# Patient Record
Sex: Male | Born: 1955 | Race: White | Hispanic: No | Marital: Married | Smoking: Never smoker
Health system: Southern US, Community
[De-identification: ages and names within clinical notes are randomized; demographics above are authoritative.]

## PROBLEM LIST (undated history)

## (undated) DIAGNOSIS — K760 Fatty (change of) liver, not elsewhere classified: Secondary | ICD-10-CM

## (undated) DIAGNOSIS — K219 Gastro-esophageal reflux disease without esophagitis: Secondary | ICD-10-CM

## (undated) DIAGNOSIS — R931 Abnormal findings on diagnostic imaging of heart and coronary circulation: Secondary | ICD-10-CM

## (undated) DIAGNOSIS — E785 Hyperlipidemia, unspecified: Secondary | ICD-10-CM

## (undated) HISTORY — DX: Abnormal findings on diagnostic imaging of heart and coronary circulation: R93.1

## (undated) HISTORY — DX: Gastro-esophageal reflux disease without esophagitis: K21.9

## (undated) HISTORY — DX: Fatty (change of) liver, not elsewhere classified: K76.0

## (undated) HISTORY — DX: Hyperlipidemia, unspecified: E78.5

---

## 2005-02-24 ENCOUNTER — Encounter: Admission: RE | Admit: 2005-02-24 | Discharge: 2005-02-24 | Payer: Self-pay | Admitting: Family Medicine

## 2005-09-18 ENCOUNTER — Encounter: Admission: RE | Admit: 2005-09-18 | Discharge: 2005-09-18 | Payer: Self-pay | Admitting: Family Medicine

## 2006-04-21 ENCOUNTER — Encounter: Admission: RE | Admit: 2006-04-21 | Discharge: 2006-04-21 | Payer: Self-pay | Admitting: Emergency Medicine

## 2010-11-09 ENCOUNTER — Encounter: Admission: RE | Admit: 2010-11-09 | Discharge: 2010-11-09 | Payer: Self-pay | Admitting: Family Medicine

## 2012-12-27 ENCOUNTER — Ambulatory Visit
Admission: RE | Admit: 2012-12-27 | Discharge: 2012-12-27 | Disposition: A | Discharge status: Home / Self Care | Payer: BC Managed Care – PPO | Source: Ambulatory Visit | Attending: Family Medicine | Admitting: Family Medicine

## 2012-12-27 ENCOUNTER — Other Ambulatory Visit: Payer: Self-pay | Admitting: Family Medicine

## 2012-12-27 DIAGNOSIS — R079 Chest pain, unspecified: Secondary | ICD-10-CM

## 2013-01-03 ENCOUNTER — Emergency Department (HOSPITAL_COMMUNITY): Payer: BC Managed Care – PPO

## 2013-01-03 ENCOUNTER — Encounter (HOSPITAL_COMMUNITY): Payer: Self-pay | Admitting: *Deleted

## 2013-01-03 DIAGNOSIS — E785 Hyperlipidemia, unspecified: Secondary | ICD-10-CM | POA: Insufficient documentation

## 2013-01-03 DIAGNOSIS — R079 Chest pain, unspecified: Secondary | ICD-10-CM | POA: Insufficient documentation

## 2013-01-03 DIAGNOSIS — Z791 Long term (current) use of non-steroidal anti-inflammatories (NSAID): Secondary | ICD-10-CM | POA: Insufficient documentation

## 2013-01-03 LAB — CBC WITH DIFFERENTIAL/PLATELET
Basophils Relative: 0 % (ref 0–1)
Eosinophils Absolute: 0.2 10*3/uL (ref 0.0–0.7)
Eosinophils Relative: 3 % (ref 0–5)
Hemoglobin: 14.9 g/dL (ref 13.0–17.0)
Lymphocytes Relative: 42 % (ref 12–46)
Lymphs Abs: 2.9 10*3/uL (ref 0.7–4.0)
MCH: 33.3 pg (ref 26.0–34.0)
MCV: 90.6 fL (ref 78.0–100.0)
Neutrophils Relative %: 46 % (ref 43–77)
Platelets: 219 10*3/uL (ref 150–400)
RBC: 4.47 MIL/uL (ref 4.22–5.81)
RDW: 12.1 % (ref 11.5–15.5)
WBC: 6.8 10*3/uL (ref 4.0–10.5)

## 2013-01-03 LAB — COMPREHENSIVE METABOLIC PANEL
ALT: 25 U/L (ref 0–53)
Albumin: 4.1 g/dL (ref 3.5–5.2)
Alkaline Phosphatase: 86 U/L (ref 39–117)
CO2: 22 mEq/L (ref 19–32)
Calcium: 9.5 mg/dL (ref 8.4–10.5)
Chloride: 103 mEq/L (ref 96–112)
Creatinine, Ser: 0.71 mg/dL (ref 0.50–1.35)
GFR calc Af Amer: >90 mL/min (ref >90)
GFR calc non Af Amer: >90 mL/min (ref >90)
Glucose, Bld: 123 mg/dL — ABNORMAL HIGH (ref 70–99)
Potassium: 3.5 mEq/L (ref 3.5–5.1)
Sodium: 138 mEq/L (ref 135–145)
Total Bilirubin: 0.4 mg/dL (ref 0.3–1.2)

## 2013-01-03 LAB — TROPONIN I: Troponin I: <0.3 ng/mL (ref <0.30)

## 2013-01-03 NOTE — ED Notes (Signed)
The pt has been having lt sided chest and abd pain for 2-3 weeks.  He  Was seen at his doctors office and had an ekg and a chest xray and he was given naproxen which he reports has not helped.  The pain gets worse when lying down and with inspiration.  No distress at  present

## 2013-01-04 ENCOUNTER — Emergency Department (HOSPITAL_COMMUNITY)
Admission: EM | Admit: 2013-01-04 | Discharge: 2013-01-04 | Disposition: A | Discharge status: Home / Self Care | Payer: BC Managed Care – PPO | Attending: Emergency Medicine | Admitting: Emergency Medicine

## 2013-01-04 ENCOUNTER — Encounter (HOSPITAL_COMMUNITY): Payer: Self-pay | Admitting: Radiology

## 2013-01-04 ENCOUNTER — Emergency Department (HOSPITAL_COMMUNITY): Payer: BC Managed Care – PPO

## 2013-01-04 DIAGNOSIS — R079 Chest pain, unspecified: Secondary | ICD-10-CM

## 2013-01-04 MED ORDER — IOHEXOL 350 MG/ML SOLN
100.0000 mL | Freq: Once | INTRAVENOUS | Status: AC | PRN
  Administered 2013-01-04: 100 mL via INTRAVENOUS

## 2013-01-04 NOTE — ED Notes (Signed)
Pt is pain free now

## 2013-01-04 NOTE — ED Provider Notes (Signed)
History     CSN: [id number]  Arrival date & time 01/03/13  2152   First MD Initiated Contact with Patient 01/04/13 0136      Chief Complaint  Patient presents with  . Chest Pain    (Consider location/radiation/quality/duration/timing/severity/associated sxs/prior treatment) HPI 57 yo male presents to the ER with complaint of 2-3 weeks of left upper chest pain.  Tonight he had burning type pain just under his nipple lasting about an hour.  Pt was seen in his primary care office, had ekg and cxr, given naprosyn.  Pain is worse with lying flat and resting, and with taking deep breath.  No pain at present.  Pt is concerned about cause of pain.  He reports he has some relief with massage last week, but pain returned the next day.  Pt denies any pain with exertion.  Pt able to play full soccer game on Sunday without pain.  NO h/o htn, diabetes, reports mild diet controlled hyperlipidemia.  No family history of CAD.   History reviewed. No pertinent past medical history.  History reviewed. No pertinent past surgical history.  No family history on file.  History  Substance Use Topics  . Smoking status: Never Smoker   . Smokeless tobacco: Not on file  . Alcohol Use: Yes      Review of Systems  All other systems reviewed and are negative.    Allergies  Review of patient's allergies indicates no known allergies.  Home Medications   Current Outpatient Rx  Name  Route  Sig  Dispense  Refill  . ADULT MULTIVITAMIN W/MINERALS CH   Oral   Take 1 tablet by mouth daily.         Marland Kitchen NAPROXEN 500 MG PO TABS   Oral   Take 500 mg by mouth 2 (two) times daily with a meal.         . PROBIOTIC PO   Oral   Take 1 capsule by mouth daily.           BP 135/64  Pulse 61  Temp 97.8 F (36.6 C) (Oral)  Resp 13  SpO2 96%  Physical Exam  Nursing note and vitals reviewed. Constitutional: He is oriented to person, place, and time. He appears well-developed and well-nourished.  HENT:   Head: Normocephalic and atraumatic.  Nose: Nose normal.  Mouth/Throat: Oropharynx is clear and moist.  Eyes: Conjunctivae normal and EOM are normal. Pupils are equal, round, and reactive to light.  Neck: Normal range of motion. Neck supple. No JVD present. No tracheal deviation present. No thyromegaly present.  Cardiovascular: Normal rate, regular rhythm, normal heart sounds and intact distal pulses.  Exam reveals no gallop and no friction rub.   No murmur heard. Pulmonary/Chest: Effort normal and breath sounds normal. No stridor. No respiratory distress. He has no wheezes. He has no rales. He exhibits no tenderness.  Abdominal: Soft. Bowel sounds are normal. He exhibits no distension and no mass. There is no tenderness. There is no rebound and no guarding.  Musculoskeletal: Normal range of motion. He exhibits no edema and no tenderness.  Lymphadenopathy:    He has no cervical adenopathy.  Neurological: He is alert and oriented to person, place, and time. He exhibits normal muscle tone. Coordination normal.  Skin: Skin is warm and dry. No rash noted. No erythema. No pallor.  Psychiatric: He has a normal mood and affect. His behavior is normal. Judgment and thought content normal.    ED Course  Procedures (  including critical care time)  Labs Reviewed  CBC WITH DIFFERENTIAL - Abnormal; Notable for the following:    MCHC 36.8 (*)     All other components within normal limits  COMPREHENSIVE METABOLIC PANEL - Abnormal; Notable for the following:    Glucose, Bld 123 (*)     All other components within normal limits  TROPONIN I   Dg Chest 2 View  01/03/2013  *RADIOLOGY REPORT*  Clinical Data: Chest pain.  CHEST - 2 VIEW  Comparison: 12/27/2012  Findings: Two views of the chest demonstrate clear lungs. Heart and mediastinum are within normal limits.  Bony thorax is intact.  No evidence for pulmonary edema.  IMPRESSION: No acute chest findings.   Original Report Authenticated By: Richarda Overlie,  M.D.    Ct Angio Chest Pe W/cm &/or Wo Cm  01/04/2013  *RADIOLOGY REPORT*  Clinical Data: Pleuritic chest pain; abdominal pain.  EKG changes.  CT ANGIOGRAPHY CHEST  Technique:  Multidetector CT imaging of the chest using the standard protocol during bolus administration of intravenous contrast. Multiplanar reconstructed images including MIPs were obtained and reviewed to evaluate the vascular anatomy.  Contrast: OMNIPAQUE IOHEXOL 350 MG/ML SOLN  Comparison: Chest radiograph performed 01/03/2013  Findings: There is no evidence of pulmonary embolus.  Minimal bibasilar dependent subsegmental atelectasis is noted.  The lungs are otherwise clear.  There is no evidence of significant focal consolidation, pleural effusion or pneumothorax.  No masses are identified; no abnormal focal contrast enhancement is seen.  The mediastinum is unremarkable in appearance.  No mediastinal lymphadenopathy is seen.  No pericardial effusion is identified. The great vessels are grossly unremarkable in appearance, aside from mild calcification at the origin of the right vertebral artery.  No axillary lymphadenopathy is seen.  The visualized portions of the thyroid gland are unremarkable in appearance.  The visualized portions of the liver and spleen are unremarkable, aside from a calcified granuloma at the peripheral hepatic dome. The visualized portions of the pancreas, stomach, adrenal glands and kidneys are within normal limits.  No acute osseous abnormalities are seen.  IMPRESSION:  1.  No evidence of pulmonary embolus. 2.  Minimal bibasilar dependent subsegmental atelectasis noted; lungs otherwise clear.   Original Report Authenticated By: Tonia Ghent, M.D.     Date: 01/04/2013  Rate: 60  Rhythm: normal sinus rhythm  QRS Axis: normal  Intervals: normal  ST/T Wave abnormalities: normal  Conduction Disutrbances:none  Narrative Interpretation:   Old EKG Reviewed: none available   1. Chest pain       MDM    57 year old male with 2-3 weeks of left-sided chest pain. Workup here has been unremarkable. CT angiogram or obtained 2 to pleuritic type pain. EKG normal, troponin negative. Patient able to play a full game of soccer earlier in the week without worsening or change in his pain. Suspect musculoskeletal in origin. We'll refer back to his primary care Dr. for further workup.        Olivia Mackie, MD 01/04/13 2053

## 2013-01-04 NOTE — ED Notes (Signed)
Pt discharged.Vital signs stable and GCS 15 

## 2014-12-10 ENCOUNTER — Ambulatory Visit
Admission: RE | Admit: 2014-12-10 | Discharge: 2014-12-10 | Disposition: A | Discharge status: Home / Self Care | Payer: BC Managed Care – PPO | Source: Ambulatory Visit | Attending: Family Medicine | Admitting: Family Medicine

## 2014-12-10 ENCOUNTER — Other Ambulatory Visit: Payer: Self-pay | Admitting: Family Medicine

## 2014-12-10 DIAGNOSIS — K5900 Constipation, unspecified: Secondary | ICD-10-CM

## 2016-07-29 DIAGNOSIS — Z0000 Encounter for general adult medical examination without abnormal findings: Secondary | ICD-10-CM | POA: Diagnosis not present

## 2016-07-29 DIAGNOSIS — Z125 Encounter for screening for malignant neoplasm of prostate: Secondary | ICD-10-CM | POA: Diagnosis not present

## 2016-07-29 DIAGNOSIS — E782 Mixed hyperlipidemia: Secondary | ICD-10-CM | POA: Diagnosis not present

## 2016-09-22 DIAGNOSIS — M542 Cervicalgia: Secondary | ICD-10-CM | POA: Diagnosis not present

## 2016-09-22 DIAGNOSIS — H9202 Otalgia, left ear: Secondary | ICD-10-CM | POA: Diagnosis not present

## 2017-09-11 DIAGNOSIS — Z0000 Encounter for general adult medical examination without abnormal findings: Secondary | ICD-10-CM | POA: Diagnosis not present

## 2017-09-11 DIAGNOSIS — Z79899 Other long term (current) drug therapy: Secondary | ICD-10-CM | POA: Diagnosis not present

## 2017-09-11 DIAGNOSIS — E782 Mixed hyperlipidemia: Secondary | ICD-10-CM | POA: Diagnosis not present

## 2018-03-13 DIAGNOSIS — J329 Chronic sinusitis, unspecified: Secondary | ICD-10-CM | POA: Diagnosis not present

## 2018-03-13 DIAGNOSIS — L989 Disorder of the skin and subcutaneous tissue, unspecified: Secondary | ICD-10-CM | POA: Diagnosis not present

## 2018-03-13 DIAGNOSIS — J209 Acute bronchitis, unspecified: Secondary | ICD-10-CM | POA: Diagnosis not present

## 2018-03-20 DIAGNOSIS — C44319 Basal cell carcinoma of skin of other parts of face: Secondary | ICD-10-CM | POA: Diagnosis not present

## 2018-04-06 DIAGNOSIS — J9801 Acute bronchospasm: Secondary | ICD-10-CM | POA: Diagnosis not present

## 2018-09-12 DIAGNOSIS — Z0000 Encounter for general adult medical examination without abnormal findings: Secondary | ICD-10-CM | POA: Diagnosis not present

## 2018-09-12 DIAGNOSIS — E782 Mixed hyperlipidemia: Secondary | ICD-10-CM | POA: Diagnosis not present

## 2018-09-12 DIAGNOSIS — Z79899 Other long term (current) drug therapy: Secondary | ICD-10-CM | POA: Diagnosis not present

## 2019-07-01 DIAGNOSIS — E782 Mixed hyperlipidemia: Secondary | ICD-10-CM | POA: Diagnosis not present

## 2019-07-01 DIAGNOSIS — M6780 Other specified disorders of synovium and tendon, unspecified site: Secondary | ICD-10-CM | POA: Diagnosis not present

## 2019-07-01 DIAGNOSIS — L609 Nail disorder, unspecified: Secondary | ICD-10-CM | POA: Diagnosis not present

## 2019-07-01 DIAGNOSIS — H8110 Benign paroxysmal vertigo, unspecified ear: Secondary | ICD-10-CM | POA: Diagnosis not present

## 2019-10-09 DIAGNOSIS — E559 Vitamin D deficiency, unspecified: Secondary | ICD-10-CM | POA: Diagnosis not present

## 2019-10-09 DIAGNOSIS — Z125 Encounter for screening for malignant neoplasm of prostate: Secondary | ICD-10-CM | POA: Diagnosis not present

## 2019-10-09 DIAGNOSIS — R39198 Other difficulties with micturition: Secondary | ICD-10-CM | POA: Diagnosis not present

## 2019-10-09 DIAGNOSIS — N390 Urinary tract infection, site not specified: Secondary | ICD-10-CM | POA: Diagnosis not present

## 2019-10-09 DIAGNOSIS — Z0000 Encounter for general adult medical examination without abnormal findings: Secondary | ICD-10-CM | POA: Diagnosis not present

## 2019-10-09 DIAGNOSIS — E782 Mixed hyperlipidemia: Secondary | ICD-10-CM | POA: Diagnosis not present

## 2019-10-29 DIAGNOSIS — R972 Elevated prostate specific antigen [PSA]: Secondary | ICD-10-CM | POA: Diagnosis not present

## 2019-12-19 DIAGNOSIS — Z03818 Encounter for observation for suspected exposure to other biological agents ruled out: Secondary | ICD-10-CM | POA: Diagnosis not present

## 2020-02-12 DIAGNOSIS — Z03818 Encounter for observation for suspected exposure to other biological agents ruled out: Secondary | ICD-10-CM | POA: Diagnosis not present

## 2020-04-21 DIAGNOSIS — R55 Syncope and collapse: Secondary | ICD-10-CM | POA: Diagnosis not present

## 2020-07-03 DIAGNOSIS — Z20822 Contact with and (suspected) exposure to covid-19: Secondary | ICD-10-CM | POA: Diagnosis not present

## 2020-08-24 DIAGNOSIS — D2262 Melanocytic nevi of left upper limb, including shoulder: Secondary | ICD-10-CM | POA: Diagnosis not present

## 2020-08-24 DIAGNOSIS — Z85828 Personal history of other malignant neoplasm of skin: Secondary | ICD-10-CM | POA: Diagnosis not present

## 2020-08-24 DIAGNOSIS — L905 Scar conditions and fibrosis of skin: Secondary | ICD-10-CM | POA: Diagnosis not present

## 2020-08-24 DIAGNOSIS — D225 Melanocytic nevi of trunk: Secondary | ICD-10-CM | POA: Diagnosis not present

## 2020-10-08 DIAGNOSIS — Z03818 Encounter for observation for suspected exposure to other biological agents ruled out: Secondary | ICD-10-CM | POA: Diagnosis not present

## 2020-10-08 DIAGNOSIS — Z20822 Contact with and (suspected) exposure to covid-19: Secondary | ICD-10-CM | POA: Diagnosis not present

## 2020-10-19 DIAGNOSIS — Z20822 Contact with and (suspected) exposure to covid-19: Secondary | ICD-10-CM | POA: Diagnosis not present

## 2020-11-04 DIAGNOSIS — Z0000 Encounter for general adult medical examination without abnormal findings: Secondary | ICD-10-CM | POA: Diagnosis not present

## 2020-11-04 DIAGNOSIS — E559 Vitamin D deficiency, unspecified: Secondary | ICD-10-CM | POA: Diagnosis not present

## 2020-11-04 DIAGNOSIS — E782 Mixed hyperlipidemia: Secondary | ICD-10-CM | POA: Diagnosis not present

## 2020-11-04 DIAGNOSIS — N401 Enlarged prostate with lower urinary tract symptoms: Secondary | ICD-10-CM | POA: Diagnosis not present

## 2020-12-25 DIAGNOSIS — L570 Actinic keratosis: Secondary | ICD-10-CM | POA: Diagnosis not present

## 2020-12-25 DIAGNOSIS — L300 Nummular dermatitis: Secondary | ICD-10-CM | POA: Diagnosis not present

## 2021-05-24 DIAGNOSIS — L239 Allergic contact dermatitis, unspecified cause: Secondary | ICD-10-CM | POA: Diagnosis not present

## 2021-11-19 DIAGNOSIS — Z0000 Encounter for general adult medical examination without abnormal findings: Secondary | ICD-10-CM | POA: Diagnosis not present

## 2021-11-19 DIAGNOSIS — E782 Mixed hyperlipidemia: Secondary | ICD-10-CM | POA: Diagnosis not present

## 2021-11-19 DIAGNOSIS — E559 Vitamin D deficiency, unspecified: Secondary | ICD-10-CM | POA: Diagnosis not present

## 2021-11-23 ENCOUNTER — Other Ambulatory Visit (HOSPITAL_BASED_OUTPATIENT_CLINIC_OR_DEPARTMENT_OTHER): Payer: Self-pay | Admitting: Family Medicine

## 2021-11-23 DIAGNOSIS — E782 Mixed hyperlipidemia: Secondary | ICD-10-CM

## 2021-12-02 ENCOUNTER — Ambulatory Visit (HOSPITAL_BASED_OUTPATIENT_CLINIC_OR_DEPARTMENT_OTHER)
Admission: RE | Admit: 2021-12-02 | Discharge: 2021-12-02 | Disposition: A | Discharge status: Home / Self Care | Payer: Self-pay | Source: Ambulatory Visit | Attending: Family Medicine | Admitting: Family Medicine

## 2021-12-02 ENCOUNTER — Encounter (HOSPITAL_BASED_OUTPATIENT_CLINIC_OR_DEPARTMENT_OTHER): Payer: Self-pay

## 2021-12-02 ENCOUNTER — Other Ambulatory Visit: Payer: Self-pay

## 2021-12-02 DIAGNOSIS — E782 Mixed hyperlipidemia: Secondary | ICD-10-CM | POA: Insufficient documentation

## 2021-12-07 DIAGNOSIS — I2510 Atherosclerotic heart disease of native coronary artery without angina pectoris: Secondary | ICD-10-CM | POA: Diagnosis not present

## 2021-12-07 DIAGNOSIS — R6882 Decreased libido: Secondary | ICD-10-CM | POA: Diagnosis not present

## 2021-12-10 DIAGNOSIS — R6882 Decreased libido: Secondary | ICD-10-CM | POA: Diagnosis not present

## 2022-02-08 DIAGNOSIS — E782 Mixed hyperlipidemia: Secondary | ICD-10-CM | POA: Diagnosis not present

## 2022-04-28 DIAGNOSIS — L237 Allergic contact dermatitis due to plants, except food: Secondary | ICD-10-CM | POA: Diagnosis not present

## 2022-05-12 DIAGNOSIS — R350 Frequency of micturition: Secondary | ICD-10-CM | POA: Diagnosis not present

## 2022-05-12 DIAGNOSIS — M791 Myalgia, unspecified site: Secondary | ICD-10-CM | POA: Diagnosis not present

## 2022-05-12 DIAGNOSIS — L819 Disorder of pigmentation, unspecified: Secondary | ICD-10-CM | POA: Diagnosis not present

## 2022-05-12 DIAGNOSIS — I2510 Atherosclerotic heart disease of native coronary artery without angina pectoris: Secondary | ICD-10-CM | POA: Diagnosis not present

## 2022-07-07 DIAGNOSIS — M791 Myalgia, unspecified site: Secondary | ICD-10-CM | POA: Diagnosis not present

## 2022-07-07 DIAGNOSIS — M2550 Pain in unspecified joint: Secondary | ICD-10-CM | POA: Diagnosis not present

## 2022-07-07 DIAGNOSIS — E782 Mixed hyperlipidemia: Secondary | ICD-10-CM | POA: Diagnosis not present

## 2022-08-16 DIAGNOSIS — R350 Frequency of micturition: Secondary | ICD-10-CM | POA: Diagnosis not present

## 2022-08-23 ENCOUNTER — Encounter (HOSPITAL_BASED_OUTPATIENT_CLINIC_OR_DEPARTMENT_OTHER): Payer: Self-pay | Admitting: Internal Medicine

## 2022-08-23 ENCOUNTER — Ambulatory Visit (HOSPITAL_BASED_OUTPATIENT_CLINIC_OR_DEPARTMENT_OTHER): Payer: BC Managed Care – PPO | Admitting: Internal Medicine

## 2022-08-23 VITALS — BP 129/80 | HR 67 | Ht 68.0 in | Wt 159.5 lb

## 2022-08-23 DIAGNOSIS — E785 Hyperlipidemia, unspecified: Secondary | ICD-10-CM

## 2022-08-23 DIAGNOSIS — R931 Abnormal findings on diagnostic imaging of heart and coronary circulation: Secondary | ICD-10-CM

## 2022-08-23 DIAGNOSIS — M791 Myalgia, unspecified site: Secondary | ICD-10-CM

## 2022-08-23 DIAGNOSIS — T466X5A Adverse effect of antihyperlipidemic and antiarteriosclerotic drugs, initial encounter: Secondary | ICD-10-CM

## 2022-08-23 MED ORDER — EZETIMIBE 10 MG PO TABS: 10.0000 mg | ORAL_TABLET | Freq: Every day | ORAL | 3 refills | Status: DC

## 2022-08-23 NOTE — Patient Instructions (Addendum)
Medication Instructions:  START zetia 10mg  daily   *If you need a refill on your cardiac medications before your next appointment, please call your pharmacy*   Lab Work: FASTING labs to check cholesterol in 3-4 months   If you have labs (blood work) drawn today and your tests are completely normal, you will receive your results only by: MyChart Message (if you have MyChart) OR A paper copy in the mail If you have any lab test that is abnormal or we need to change your treatment, we will call you to review the results.   Testing/Procedures: NONE   Follow-Up: At [facility], you and your health needs are our priority.  As part of our continuing mission to provide you with exceptional heart care, we have created designated Provider Care Teams.  These Care Teams include your primary Cardiologist (physician) and Advanced Practice Providers (APPs -  Physician Assistants and Nurse Practitioners) who all work together to provide you with the care you need, when you need it.  We recommend signing up for the patient portal called "MyChart".  Sign up information is provided on this After Visit Summary.  MyChart is used to connect with patients for Virtual Visits (Telemedicine).  Patients are able to view lab/test results, encounter notes, upcoming appointments, etc.  Non-urgent messages can be sent to your provider as well.   To learn more about what you can do with MyChart, go to [facility].    Your next appointment:   3-4 months with Dr. [email or web address]

## 2022-08-25 ENCOUNTER — Encounter (HOSPITAL_BASED_OUTPATIENT_CLINIC_OR_DEPARTMENT_OTHER): Payer: Self-pay | Admitting: Internal Medicine

## 2022-08-25 NOTE — Progress Notes (Signed)
LIPID CLINIC CONSULT NOTE  Chief Complaint:  Manage dyslipidemia  Primary Care Physician: Darrow Bussing, MD  Primary Cardiologist:  None  HPI:  Dave Poole is a 66 y.o. male who is being seen today for the evaluation of dyslipidemia at the request of Mila Palmer, MD. This is a pleasant [place] male with a history of mildly elevated coronary calcium with a score of 15, 35th percentile for age and sex matched controls.  Recently he had lipids which showed total cholesterol of 200, triglycerides 180, HDL 51 and LDL of 118.  Prior to that the LDL had been a little higher at 128.  He is not currently on any lipid-lowering therapies.  He had previously taken simvastatin, about 40 pills and noted that he was having joint and muscle pains.  Prior to that he had taken Crestor which also cause muscle aches.  Based on that he was referred for medication recommendations.  PMHx:  Past Medical History:  Diagnosis Date   Dyslipidemia    Elevated coronary artery calcium score    GERD (gastroesophageal reflux disease)    Hepatic steatosis     No past surgical history on file.  FAMHx:  No family history on file.  No premature onset coronary artery disease  SOCHx:   reports that he has never smoked. He does not have any smokeless tobacco history on file. He reports current alcohol use. No history on file for drug use.  ALLERGIES:  Allergies  Allergen Reactions   Statins Other (See Comments)    Myalgias - simvastatin, rosuvastatin     ROS: Pertinent items noted in HPI and remainder of comprehensive ROS otherwise negative.  HOME MEDS: Current Outpatient Medications on File Prior to Visit  Medication Sig Dispense Refill   Probiotic Product (PROBIOTIC PO) Take 1 capsule by mouth daily.     valACYclovir (VALTREX) 1000 MG tablet Take 1,000 mg by mouth as needed.     No current facility-administered medications on file prior to visit.    LABS/IMAGING: No results found for  this or any previous visit (from the past 48 hour(s)). No results found.  LIPID PANEL: No results found for: "CHOL", "TRIG", "HDL", "CHOLHDL", "VLDL", "LDLCALC", "LDLDIRECT"  WEIGHTS: Wt Readings from Last 3 Encounters:  08/23/22 159 lb 8 oz (72.3 kg)    VITALS: BP 129/80 (BP Location: Left Arm, Patient Position: Sitting, Cuff Size: Normal)   Pulse 67   Ht 5\' 8"  (1.727 m)   Wt 159 lb 8 oz (72.3 kg)   SpO2 98%   BMI 24.25 kg/m   EXAM: Deferred  EKG: Deferred  ASSESSMENT: Mixed dyslipidemia, goal LDL <70 Stain myalgias Elevated CAC score of 15, 35th percentile  PLAN: 1.   Mr. Slinger has an elevated coronary artery calcium score which is quite low and compared to peers at lower risk.  His lipid profile is not awful off of therapy but he has been intermittently on statins which caused myalgias.  He is very hesitant to consider trying another statin.  I would like to proceed with guideline therapies and add ezetimibe 10 mg daily to his regimen.  I expect this will be better tolerated and will give him another 20 to 25% reduction in his cholesterol.  If he remains above target we might consider adding Nexletol.  Thanks again for the kind referral.  We will plan repeat lipids and follow-up in 3 to 4 months.  Orland Penman, MD, [facility], FACP  [place]  [facility]  HeartCare  Medical Director of the Advanced Lipid Disorders &  Cardiovascular Risk Reduction Clinic Diplomate of the American Board of Clinical Lipidology Attending Cardiologist  Direct Dial: [phone]  Fax: [phone]  Website:  [email or web address] 08/25/2022, 8:27 PM

## 2022-10-05 DIAGNOSIS — D485 Neoplasm of uncertain behavior of skin: Secondary | ICD-10-CM | POA: Diagnosis not present

## 2022-10-05 DIAGNOSIS — L821 Other seborrheic keratosis: Secondary | ICD-10-CM | POA: Diagnosis not present

## 2022-11-28 DIAGNOSIS — E782 Mixed hyperlipidemia: Secondary | ICD-10-CM | POA: Diagnosis not present

## 2022-11-28 DIAGNOSIS — Z79899 Other long term (current) drug therapy: Secondary | ICD-10-CM | POA: Diagnosis not present

## 2022-11-28 DIAGNOSIS — Z0000 Encounter for general adult medical examination without abnormal findings: Secondary | ICD-10-CM | POA: Diagnosis not present

## 2022-12-19 ENCOUNTER — Ambulatory Visit: Payer: Self-pay | Admitting: Internal Medicine

## 2022-12-29 DIAGNOSIS — E785 Hyperlipidemia, unspecified: Secondary | ICD-10-CM | POA: Diagnosis not present

## 2022-12-30 LAB — NMR, LIPOPROFILE
Cholesterol, Total: 183 mg/dL (ref 100–199)
HDL Particle Number: 29.5 umol/L — ABNORMAL LOW (ref >=30.5)
HDL-C: 42 mg/dL (ref >39)
LDL Particle Number: 1254 nmol/L — ABNORMAL HIGH (ref <1000)
LDL Size: 20.6 nm (ref >20.5)
LDL-C (NIH Calc): 115 mg/dL — ABNORMAL HIGH (ref 0–99)
LP-IR Score: 63 — ABNORMAL HIGH (ref <=45)
Small LDL Particle Number: 508 nmol/L (ref <=527)
Triglycerides: 145 mg/dL (ref 0–149)

## 2022-12-30 LAB — LIPOPROTEIN A (LPA): Lipoprotein (a): 50.9 nmol/L (ref <75.0)

## 2023-01-04 ENCOUNTER — Ambulatory Visit (HOSPITAL_BASED_OUTPATIENT_CLINIC_OR_DEPARTMENT_OTHER): Payer: BC Managed Care – PPO | Admitting: Internal Medicine

## 2023-01-04 ENCOUNTER — Telehealth: Payer: Self-pay | Admitting: Internal Medicine

## 2023-01-04 ENCOUNTER — Encounter (HOSPITAL_BASED_OUTPATIENT_CLINIC_OR_DEPARTMENT_OTHER): Payer: Self-pay | Admitting: Internal Medicine

## 2023-01-04 VITALS — BP 110/78 | HR 75 | Ht 68.0 in | Wt 161.0 lb

## 2023-01-04 DIAGNOSIS — T466X5A Adverse effect of antihyperlipidemic and antiarteriosclerotic drugs, initial encounter: Secondary | ICD-10-CM

## 2023-01-04 DIAGNOSIS — M791 Myalgia, unspecified site: Secondary | ICD-10-CM

## 2023-01-04 DIAGNOSIS — R931 Abnormal findings on diagnostic imaging of heart and coronary circulation: Secondary | ICD-10-CM

## 2023-01-04 DIAGNOSIS — E785 Hyperlipidemia, unspecified: Secondary | ICD-10-CM

## 2023-01-04 DIAGNOSIS — T466X5D Adverse effect of antihyperlipidemic and antiarteriosclerotic drugs, subsequent encounter: Secondary | ICD-10-CM | POA: Diagnosis not present

## 2023-01-04 MED ORDER — NEXLIZET 180-10 MG PO TABS: 1.0000 | ORAL_TABLET | Freq: Every day | ORAL | 3 refills | Status: DC

## 2023-01-04 NOTE — Patient Instructions (Addendum)
Medication Instructions:  START Nexlizet 180-10mg  daily ** we will work on a prior authorization for this and let you know if insurance approves this ** once approved, you will stop zetia and only take Nexlizet   *If you need a refill on your cardiac medications before your next appointment, please call your pharmacy*   Lab Work: FASTING lab work to check cholesterol in 3-4 months  If you have labs (blood work) drawn today and your tests are completely normal, you will receive your results only by: [place] (if you have MyChart) OR A paper copy in the mail If you have any lab test that is abnormal or we need to change your treatment, we will call you to review the results.    Follow-Up: At [facility], you and your health needs are our priority.  As part of our continuing mission to provide you with exceptional heart care, we have created designated Provider Care Teams.  These Care Teams include your primary Cardiologist (physician) and Advanced Practice Providers (APPs -  Physician Assistants and Nurse Practitioners) who all work together to provide you with the care you need, when you need it.  We recommend signing up for the patient portal called "MyChart".  Sign up information is provided on this After Visit Summary.  MyChart is used to connect with patients for Virtual Visits (Telemedicine).  Patients are able to view lab/test results, encounter notes, upcoming appointments, etc.  Non-urgent messages can be sent to your provider as well.   To learn more about what you can do with MyChart, go to [email or web address].    Your next appointment:    3-4 months with Dr. Debara Pickett -- lipid clinic ** can be in-person or video/virtual visit

## 2023-01-04 NOTE — Progress Notes (Signed)
LIPID CLINIC CONSULT NOTE  Chief Complaint:  Manage dyslipidemia  Primary Care Physician: Jonathon Jordan, MD  Primary Cardiologist:  None  HPI:  Dave Poole is a 67 y.o. male who is being seen today for the evaluation of dyslipidemia at the request of Koirala, Dibas, MD. This is a pleasant [place] male with a history of mildly elevated coronary calcium with a score of 15, 35th percentile for age and sex matched controls.  Recently he had lipids which showed total cholesterol of 200, triglycerides 180, HDL 51 and LDL of 118.  Prior to that the LDL had been a little higher at 128.  He is not currently on any lipid-lowering therapies.  He had previously taken simvastatin, about 40 pills and noted that he was having joint and muscle pains.  Prior to that he had taken Crestor which also cause muscle aches.  Based on that he was referred for medication recommendations.  01/04/2023  Dave Poole returns today for follow-up.  He has done well with ezetimibe and notes no side effects.  His lipids are better however essentially back to what his labs demonstrated about a year ago.  Cholesterols come down from 148 down to 115.  His LDL particle number is 1254, triglycerides are now normal at 145, HDL 42 and small LDL particle #508.  Overall pretty good improvement in his lipids.  He is still slightly above target.  I suspect he will need additional therapy as he has a very well-controlled diet.  He has tried to make even further changes over the past week.  Despite this, I think that he is going up against the genetic dyslipidemia and will need additional therapy.  PMHx:  Past Medical History:  Diagnosis Date   Dyslipidemia    Elevated coronary artery calcium score    GERD (gastroesophageal reflux disease)    Hepatic steatosis     No past surgical history on file.  FAMHx:  No family history on file.  No premature onset coronary artery disease  SOCHx:   reports that he has never  smoked. He does not have any smokeless tobacco history on file. He reports current alcohol use. No history on file for drug use.  ALLERGIES:  Allergies  Allergen Reactions   Statins Other (See Comments)    Myalgias - simvastatin, rosuvastatin     ROS: Pertinent items noted in HPI and remainder of comprehensive ROS otherwise negative.  HOME MEDS: Current Outpatient Medications on File Prior to Visit  Medication Sig Dispense Refill   ezetimibe (ZETIA) 10 MG tablet Take 1 tablet (10 mg total) by mouth daily. 90 tablet 3   Probiotic Product (PROBIOTIC PO) Take 1 capsule by mouth daily.     valACYclovir (VALTREX) 1000 MG tablet Take 1,000 mg by mouth as needed.     No current facility-administered medications on file prior to visit.    LABS/IMAGING: No results found for this or any previous visit (from the past 48 hour(s)). No results found.  LIPID PANEL: No results found for: "CHOL", "TRIG", "HDL", "CHOLHDL", "VLDL", "LDLCALC", "LDLDIRECT"  WEIGHTS: Wt Readings from Last 3 Encounters:  01/04/23 161 lb (73 kg)  08/23/22 159 lb 8 oz (72.3 kg)    VITALS: BP 110/78   Pulse 75   Ht 5\' 8"  (1.727 m)   Wt 161 lb (73 kg)   BMI 24.48 kg/m   EXAM: Deferred  EKG: Deferred  ASSESSMENT: Mixed dyslipidemia, goal LDL <100 Statin myalgias Elevated CAC score of 15,  35th percentile  PLAN: 1.   Dave Poole has had an elevated calcium score but not significant age.  Unfortunately he cannot take statins having failed multiple statins in the past due to myalgias.  He is tolerating ezetimibe but has not reached a target LDL less than 100 which is set because he is considered probably a lower risk patient.  Nonetheless I think that his diet is totally optimized at this point.  His weight is normal.  He has completely cut out cholesterol, markedly reduce saturated fats and reduced carbohydrates.  He would be a good candidate for Nexletol for additional lipid lowering since he has been  intolerant of statins.  Plan follow-up with repeat lipids in about 3 to 4 months.  Pixie Casino, MD, [facility], [place] Director of the Advanced Lipid Disorders &  Cardiovascular Risk Reduction Clinic Diplomate of the American Board of Clinical Lipidology Attending Cardiologist  Direct Dial: [phone]  Fax: [phone]  Website:  [email or web address] 01/04/2023, 9:31 AM

## 2023-01-04 NOTE — Telephone Encounter (Signed)
PA for Nexlizet submitted via [place]: [id number]

## 2023-01-11 NOTE — Telephone Encounter (Signed)
Patient denied coverage for Nexlizet.   Please select the type of coverage being requested:  Initial  Continuation  Does the patient have a diagnosis of heterozygous familial hypercholesterolemia (HeFH)?  Yes  No  Does the patient have a diagnosis of clinical atherosclerotic cardiovascular disease (ASCVD)?  Yes  No  Does the patient have any of the following (check all that apply)?  Acute coronary syndrome  History of myocardial infarction  Stable or unstable angina  Coronary or other arterial revascularization  Stroke  Transient ischemic attack  Peripheral arterial disease, including aortic aneurysm (of atherosclerotic origin)  None of the above  Is the patient on maximally tolerated statin therapy?  Yes  No  Does the patient have an intolerance or hypersensitivity to statin therapy?  Yes  No  Is the amount requested greater than 1 tablet daily?  Yes  No

## 2023-01-16 NOTE — Telephone Encounter (Signed)
Patient called about Nexlizet denial. Advised to continue zetia and diet changes. He turns 67 in July and will change to Medicare. Encouraged him to shop around for plans and notified up of the [facility] program for resources  [place], Nadean Corwin, MD  You2 days ago    Would continue with diet and zetia.  Dr Lemmie Evens

## 2023-01-17 ENCOUNTER — Encounter (HOSPITAL_BASED_OUTPATIENT_CLINIC_OR_DEPARTMENT_OTHER): Payer: Self-pay

## 2023-05-23 LAB — NMR, LIPOPROFILE
Cholesterol, Total: 223 mg/dL — ABNORMAL HIGH (ref 100–199)
HDL Particle Number: 32.2 umol/L (ref >=30.5)
HDL-C: 41 mg/dL (ref >39)
LDL Particle Number: 1896 nmol/L — ABNORMAL HIGH (ref <1000)
LDL Size: 20.3 nm — ABNORMAL LOW (ref >20.5)
LDL-C (NIH Calc): 155 mg/dL — ABNORMAL HIGH (ref 0–99)
LP-IR Score: 70 — ABNORMAL HIGH (ref <=45)
Small LDL Particle Number: 1065 nmol/L — ABNORMAL HIGH (ref <=527)
Triglycerides: 149 mg/dL (ref 0–149)

## 2023-05-30 ENCOUNTER — Encounter (HOSPITAL_BASED_OUTPATIENT_CLINIC_OR_DEPARTMENT_OTHER): Payer: Self-pay | Admitting: Internal Medicine

## 2023-05-30 ENCOUNTER — Ambulatory Visit (HOSPITAL_BASED_OUTPATIENT_CLINIC_OR_DEPARTMENT_OTHER): Payer: BC Managed Care – PPO | Admitting: Internal Medicine

## 2023-05-30 VITALS — BP 134/84 | HR 61 | Ht 68.0 in | Wt 160.6 lb

## 2023-05-30 DIAGNOSIS — T466X5D Adverse effect of antihyperlipidemic and antiarteriosclerotic drugs, subsequent encounter: Secondary | ICD-10-CM

## 2023-05-30 DIAGNOSIS — T466X5A Adverse effect of antihyperlipidemic and antiarteriosclerotic drugs, initial encounter: Secondary | ICD-10-CM

## 2023-05-30 DIAGNOSIS — R931 Abnormal findings on diagnostic imaging of heart and coronary circulation: Secondary | ICD-10-CM | POA: Diagnosis not present

## 2023-05-30 DIAGNOSIS — R0609 Other forms of dyspnea: Secondary | ICD-10-CM | POA: Diagnosis not present

## 2023-05-30 DIAGNOSIS — M791 Myalgia, unspecified site: Secondary | ICD-10-CM

## 2023-05-30 DIAGNOSIS — E785 Hyperlipidemia, unspecified: Secondary | ICD-10-CM

## 2023-05-30 MED ORDER — NEXLETOL 180 MG PO TABS: 1.0000 | ORAL_TABLET | Freq: Every day | ORAL | 0 refills | Status: DC

## 2023-05-30 NOTE — Addendum Note (Signed)
Addended by: Lindell Spar on: 05/30/2023 05:08 PM   Modules accepted: Orders

## 2023-05-30 NOTE — Progress Notes (Signed)
LIPID CLINIC CONSULT NOTE  Chief Complaint:  Manage dyslipidemia, shortness of breath  Primary Care Physician: Mila Palmer, MD  Primary Cardiologist:  None  HPI:  Dave Poole is a 67 y.o. male who is being seen today for the evaluation of dyslipidemia at the request of Mila Palmer, MD. This is a pleasant [place] male with a history of mildly elevated coronary calcium with a score of 15, 35th percentile for age and sex matched controls.  Recently he had lipids which showed total cholesterol of 200, triglycerides 180, HDL 51 and LDL of 118.  Prior to that the LDL had been a little higher at 128.  He is not currently on any lipid-lowering therapies.  He had previously taken simvastatin, about 40 pills and noted that he was having joint and muscle pains.  Prior to that he had taken Crestor which also cause muscle aches.  Based on that he was referred for medication recommendations.  01/04/2023  Mr. [name] returns today for follow-up.  He has done well with ezetimibe and notes no side effects.  His lipids are better however essentially back to what his labs demonstrated about a year ago.  Cholesterols come down from 148 down to 115.  His LDL particle number is 1254, triglycerides are now normal at 145, HDL 42 and small LDL particle #508.  Overall pretty good improvement in his lipids.  He is still slightly above target.  I suspect he will need additional therapy as he has a very well-controlled diet.  He has tried to make even further changes over the past week.  Despite this, I think that he is going up against the genetic dyslipidemia and will need additional therapy.  05/30/2023  Mr. [name] is seen today in follow-up.  Unfortunately he recently had developed some overall body aches particularly in his shoulders and hips which he thought might be related to ezetimibe.  He stopped that medicine in early May and had recent repeat labs in June showing total cholesterol 223, HDL  41, triglycerides 149 and LDL 155.  This was up from an LDL of 115 on ezetimibe.  Currently he is on no lipid-lowering therapy.  We had discussed the possibility of adding Nexletol however it was declined because he was not on Medicare.  Subsequently he has looked into options for Medicare which would start as of July 1.  He also mention today he gets some shortness of breath with short burst activities such as chasing after his dog or changing position that lasts just a few seconds.  He notes that he is able to exercise on a treadmill for 30 to 45 minutes without any significant or unusual shortness of breath or limitation to exercise.  He denies any chest pain.  This does not sound typically cardiac.  He does have some history of atelectasis on imaging which could contribute to this.  PMHx:  Past Medical History:  Diagnosis Date   Dyslipidemia    Elevated coronary artery calcium score    GERD (gastroesophageal reflux disease)    Hepatic steatosis     No past surgical history on file.  FAMHx:  No family history on file.  No premature onset coronary artery disease  SOCHx:   reports that he has never smoked. He does not have any smokeless tobacco history on file. He reports current alcohol use. No history on file for drug use.  ALLERGIES:  Allergies  Allergen Reactions   Statins Other (See Comments)  Myalgias - simvastatin, rosuvastatin     ROS: Pertinent items noted in HPI and remainder of comprehensive ROS otherwise negative.  HOME MEDS: Current Outpatient Medications on File Prior to Visit  Medication Sig Dispense Refill   ezetimibe (ZETIA) 10 MG tablet Take 10 mg by mouth daily.     Probiotic Product (PROBIOTIC PO) Take 1 capsule by mouth daily.     valACYclovir (VALTREX) 1000 MG tablet Take 1,000 mg by mouth as needed.     No current facility-administered medications on file prior to visit.    LABS/IMAGING: No results found for this or any previous visit (from the past  48 hour(s)). No results found.  LIPID PANEL: No results found for: "CHOL", "TRIG", "HDL", "CHOLHDL", "VLDL", "LDLCALC", "LDLDIRECT"  WEIGHTS: Wt Readings from Last 3 Encounters:  05/30/23 160 lb 9.6 oz (72.8 kg)  01/04/23 161 lb (73 kg)  08/23/22 159 lb 8 oz (72.3 kg)    VITALS: BP 134/84 (BP Location: Left Arm, Patient Position: Sitting, Cuff Size: Normal)   Pulse 61   Ht 5\' 8"  (1.727 m)   Wt 160 lb 9.6 oz (72.8 kg)   SpO2 99%   BMI 24.42 kg/m   EXAM: Deferred  EKG: Deferred  ASSESSMENT: Mixed dyslipidemia, goal LDL <100 Statin myalgias Elevated CAC score of 15, 35th percentile Ezetimibe intolerance  PLAN: 1.   Mr. [name] has developed what sounds like an intolerance to ezetimibe.  After stopping the medicine for several weeks his symptoms are improving.  I advise stopping that medicine and will recommend proceeding with Nexletol 180 mg daily.  This should be covered with his elected insurance option for Medicare as of July 1.  With regards to his shortness of breath complaints, they are limited and happen only occasionally with short burst activities.  I advised him to contact us if he has more shortness of breath symptoms with exercises or develops any chest pain.  Plan follow-up with repeat lipids in about 3 to 4 months.  Chrystie Nose, MD, [facility], FACP  [place]  [facility]  Medical Director of the Advanced Lipid Disorders &  Cardiovascular Risk Reduction Clinic Diplomate of the American Board of Clinical Lipidology Attending Cardiologist  Direct Dial: [phone]  Fax: [phone]  Website:  [email or web address] 05/30/2023, 11:25 AM

## 2023-05-30 NOTE — Patient Instructions (Signed)
Medication Instructions:  START Nexletol   PLEASE CALL OUR OFFICE with your new Medicare insurance   *If you need a refill on your cardiac medications before your next appointment, please call your pharmacy*   Lab Work: FASTING lab work in 4 months  If you have labs (blood work) drawn today and your tests are completely normal, you will receive your results only by: [employer] (if you have MyChart) OR A paper copy in the mail If you have any lab test that is abnormal or we need to change your treatment, we will call you to review the results.   Follow-Up: At [facility], you and your health needs are our priority.  As part of our continuing mission to provide you with exceptional heart care, we have created designated Provider Care Teams.  These Care Teams include your primary Cardiologist (physician) and Advanced Practice Providers (APPs -  Physician Assistants and Nurse Practitioners) who all work together to provide you with the care you need, when you need it.  We recommend signing up for the patient portal called "MyChart".  Sign up information is provided on this After Visit Summary.  MyChart is used to connect with patients for Virtual Visits (Telemedicine).  Patients are able to view lab/test results, encounter notes, upcoming appointments, etc.  Non-urgent messages can be sent to your provider as well.   To learn more about what you can do with MyChart, go to [email or web address].    Your next appointment:    4 months with Dr. Rennis Golden or Marcelino Duster NP

## 2023-06-12 ENCOUNTER — Telehealth: Payer: Self-pay

## 2023-06-12 ENCOUNTER — Other Ambulatory Visit (HOSPITAL_COMMUNITY): Payer: Self-pay

## 2023-06-12 MED ORDER — NEXLETOL 180 MG PO TABS: 1.0000 | ORAL_TABLET | Freq: Every day | ORAL | 11 refills | Status: DC

## 2023-06-12 NOTE — Telephone Encounter (Signed)
Pharmacy Patient Advocate Encounter  Prior Authorization for NEXLETOL has been approved.    Effective dates: 06/12/23 through 06/11/24  Haze Rushing, CPhT Pharmacy Patient Advocate Specialist Direct Number: [phone] Fax: [phone]

## 2023-06-12 NOTE — Telephone Encounter (Signed)
Pharmacy Patient Advocate Encounter   Received notification from [employer] that prior authorization for NEXLETOL 180 MG TAB is needed.    PA submitted on 06/12/23 Key [id number] Status is pending  Haze Rushing, CPhT Pharmacy Patient Advocate Specialist Direct Number: [phone] Fax: [phone]

## 2023-06-12 NOTE — Telephone Encounter (Signed)
Patient called w/update on med approval. Rx(s) sent to pharmacy electronically. He is on week 1 of samples. He will notify office if he has issues w/samples and no intent to pick up Rx. He had residual muscle aches, thus delay in starting.

## 2023-06-12 NOTE — Addendum Note (Signed)
Addended by: Lindell Spar on: 06/12/2023 04:38 PM   Modules accepted: Orders

## 2023-08-17 ENCOUNTER — Emergency Department (HOSPITAL_BASED_OUTPATIENT_CLINIC_OR_DEPARTMENT_OTHER): Payer: Medicare Other

## 2023-08-17 ENCOUNTER — Other Ambulatory Visit: Payer: Self-pay

## 2023-08-17 ENCOUNTER — Encounter (HOSPITAL_BASED_OUTPATIENT_CLINIC_OR_DEPARTMENT_OTHER): Payer: Self-pay | Admitting: Pediatrics

## 2023-08-17 ENCOUNTER — Emergency Department (HOSPITAL_BASED_OUTPATIENT_CLINIC_OR_DEPARTMENT_OTHER)
Admission: EM | Admit: 2023-08-17 | Discharge: 2023-08-17 | Disposition: A | Discharge status: Home / Self Care | Payer: Medicare Other | Attending: Emergency Medicine | Admitting: Emergency Medicine

## 2023-08-17 DIAGNOSIS — R253 Fasciculation: Secondary | ICD-10-CM | POA: Insufficient documentation

## 2023-08-17 DIAGNOSIS — R252 Cramp and spasm: Secondary | ICD-10-CM | POA: Insufficient documentation

## 2023-08-17 DIAGNOSIS — G245 Blepharospasm: Secondary | ICD-10-CM

## 2023-08-17 LAB — CBC WITH DIFFERENTIAL/PLATELET
Abs Immature Granulocytes: 0.01 10*3/uL (ref 0.00–0.07)
Basophils Absolute: 0 10*3/uL (ref 0.0–0.1)
Basophils Relative: 1 %
Eosinophils Absolute: 0.2 10*3/uL (ref 0.0–0.5)
Eosinophils Relative: 4 %
HCT: 45.1 % (ref 39.0–52.0)
Hemoglobin: 15.7 g/dL (ref 13.0–17.0)
Immature Granulocytes: 0 %
Lymphocytes Relative: 43 %
Lymphs Abs: 2.1 10*3/uL (ref 0.7–4.0)
MCH: 32.9 pg (ref 26.0–34.0)
MCHC: 34.8 g/dL (ref 30.0–36.0)
MCV: 94.5 fL (ref 80.0–100.0)
Monocytes Absolute: 0.4 10*3/uL (ref 0.1–1.0)
Monocytes Relative: 8 %
Neutro Abs: 2.1 10*3/uL (ref 1.7–7.7)
Neutrophils Relative %: 44 %
Platelets: 238 10*3/uL (ref 150–400)
RBC: 4.77 MIL/uL (ref 4.22–5.81)
RDW: 11.7 % (ref 11.5–15.5)
WBC: 4.8 10*3/uL (ref 4.0–10.5)
nRBC: 0 % (ref 0.0–0.2)

## 2023-08-17 LAB — COMPREHENSIVE METABOLIC PANEL
ALT: 26 U/L (ref 0–44)
AST: 32 U/L (ref 15–41)
Albumin: 4.5 g/dL (ref 3.5–5.0)
Alkaline Phosphatase: 78 U/L (ref 38–126)
Anion gap: 7 (ref 5–15)
BUN: 10 mg/dL (ref 8–23)
CO2: 25 mmol/L (ref 22–32)
Calcium: 9 mg/dL (ref 8.9–10.3)
Chloride: 106 mmol/L (ref 98–111)
Creatinine, Ser: 0.93 mg/dL (ref 0.61–1.24)
GFR, Estimated: >60 mL/min (ref >60)
Glucose, Bld: 106 mg/dL — ABNORMAL HIGH (ref 70–99)
Potassium: 4.9 mmol/L (ref 3.5–5.1)
Sodium: 138 mmol/L (ref 135–145)
Total Bilirubin: 0.5 mg/dL (ref 0.3–1.2)
Total Protein: 7.2 g/dL (ref 6.5–8.1)

## 2023-08-17 LAB — TROPONIN I (HIGH SENSITIVITY): Troponin I (High Sensitivity): 5 ng/L (ref <18)

## 2023-08-17 LAB — MAGNESIUM: Magnesium: 2.3 mg/dL (ref 1.7–2.4)

## 2023-08-17 MED ORDER — IOHEXOL 350 MG/ML SOLN
100.0000 mL | Freq: Once | INTRAVENOUS | Status: AC | PRN
  Administered 2023-08-17: 75 mL via INTRAVENOUS

## 2023-08-17 MED ORDER — SODIUM CHLORIDE 0.9 % IV BOLUS
1000.0000 mL | Freq: Once | INTRAVENOUS | Status: AC
  Administered 2023-08-17: 1000 mL via INTRAVENOUS

## 2023-08-17 NOTE — Discharge Instructions (Signed)
The MRI and CT scan of the neck today were normal.  There is no sign of stroke or blockages.  Your labs also look normal today.  If you continue to have this eye twitching follow back up with your primary doctor.

## 2023-08-17 NOTE — ED Provider Notes (Signed)
[place] EMERGENCY DEPARTMENT AT [facility] Provider Note   CSN: [id number] Arrival date & time: 08/17/23  1135     History  Chief Complaint  Patient presents with   Leg Pain   Eye Problem    Dave Poole is a 67 y.o. male here with his wife for brief episode of blurry vision, eye twitching and general feeling of unwellness with metallic taste in mouth.  Lasted for couple hours, now completely resolved.  No speech finding or word finding difficulty. Saw his PCP for this yesterday, who had concern for TIA and started him on aspirin 81 mg daily and ordered outpatient MRI.  Patient came to the ED this morning because of the recurrence of the symptoms this morning and that concerned him. Denies chest pain, pressure or tightness.  Around 3 weeks ago, he lost consciousness and had head trauma.        Home Medications Prior to Admission medications   Medication Sig Start Date End Date Taking? Authorizing Provider  Bempedoic Acid (NEXLETOL) 180 MG TABS Take 1 tablet (180 mg total) by mouth daily. 06/12/23   Hilty, Lisette Abu, MD  Probiotic Product (PROBIOTIC PO) Take 1 capsule by mouth daily.    [provider]  valACYclovir (VALTREX) 1000 MG tablet Take 1,000 mg by mouth as needed. 04/28/22   [provider]      Allergies    Statins    Review of Systems   Review of Systems  Cardiovascular:  Negative for chest pain.  Gastrointestinal:  Negative for nausea and vomiting.  Musculoskeletal:  Negative for gait problem.  Neurological:  Negative for speech difficulty, weakness and numbness.    Physical Exam Updated Vital Signs BP (!) 153/90   Pulse 64   Temp 97.7 F (36.5 C) (Oral)   Resp 13   Ht 5\' 9"  (1.753 m)   Wt 72.6 kg   SpO2 100%   BMI 23.63 kg/m  Physical Exam Constitutional:      Appearance: Normal appearance.  HENT:     Mouth/Throat:     Mouth: Mucous membranes are dry.     Pharynx: Oropharynx is clear.  Eyes:     Extraocular  Movements: Extraocular movements intact.     Conjunctiva/sclera: Conjunctivae normal.     Pupils: Pupils are equal, round, and reactive to light.  Cardiovascular:     Rate and Rhythm: Normal rate and regular rhythm.  Pulmonary:     Effort: Pulmonary effort is normal.     Breath sounds: Normal breath sounds.  Abdominal:     Palpations: Abdomen is soft.  Musculoskeletal:     Cervical back: Normal range of motion and neck supple.  Neurological:     General: No focal deficit present.     Mental Status: He is alert and oriented to person, place, and time.     Cranial Nerves: No cranial nerve deficit.     Sensory: No sensory deficit.     Motor: No weakness.     Comments: Speech clear and fluent, 5/5 bilateral upper extremity and bilateral lower extremity strength  Psychiatric:        Mood and Affect: Mood normal.        Behavior: Behavior normal.     ED Results / Procedures / Treatments   Labs (all labs ordered are listed, but only abnormal results are displayed) Labs Reviewed  COMPREHENSIVE METABOLIC PANEL - Abnormal; Notable for the following components:      Result Value  Glucose, Bld 106 (*)    All other components within normal limits  CBC WITH DIFFERENTIAL/PLATELET  MAGNESIUM  TROPONIN I (HIGH SENSITIVITY)  TROPONIN I (HIGH SENSITIVITY)    EKG None  Radiology MR BRAIN WO CONTRAST  Result Date: 08/17/2023 CLINICAL DATA:  Syncope/presyncope, cerebrovascular cause suspected EXAM: MRI HEAD WITHOUT CONTRAST TECHNIQUE: Multiplanar, multiecho pulse sequences of the brain and surrounding structures were obtained without intravenous contrast. COMPARISON:  None Available. FINDINGS: Brain: Negative for an acute infarct. No hemorrhage. No hydrocephalus. No extra-axial fluid collection. Sequela of mild chronic microvascular ischemic change. No mass effect. No mass lesion. Vascular: Normal flow voids. Skull and upper cervical spine: Normal marrow signal. Sinuses/Orbits: No middle ear  or mastoid effusion. Paranasal sinuses are notable for mucosal thickening in the bilateral ethmoid and left sphenoid sinus. Orbits are unremarkable. Other: None. IMPRESSION: No acute intracranial process. Electronically Signed   By: Lorenza Cambridge M.D.   On: 08/17/2023 14:36    Procedures Procedures    Medications Ordered in ED Medications  sodium chloride 0.9 % bolus 1,000 mL (1,000 mLs Intravenous New Bag/Given 08/17/23 1413)    ED Course/ Medical Decision Making/ A&P                                 Medical Decision Making Well-appearing 67 year old male with muscle cramping, fasciculation in right eye and left leg, and now resolved blurred vision.  He was ANO x 4, with completely benign neurological exam without deficits.  Strength and sensation intact.  No visual field deficits.  MRI negative for acute intracranial process including acute CVA.  Discussed that his symptoms could be TIA, but constellation of symptoms not fitting with typical TIA.  CT angio neck ordered to rule out carotid stenosis contributory to symptoms. He did appear a bit dehydrated on exam, treated with IV fluids.  Consider orthostasis with recent syncopal episode. Can also consider seizure-like activity, however no postictal state.  No electrolyte derangements noted on blood work.  Patient care handed off to attending physician at shift change.  Pending CT angio neck.  Amount and/or Complexity of Data Reviewed Labs: ordered. Radiology: ordered.          Final Clinical Impression(s) / ED Diagnoses Final diagnoses:  None    Rx / DC Orders ED Discharge Orders     None         Darral Dash, DO 08/17/23 1516    Franne Forts, DO 08/22/23 [id number]

## 2023-08-17 NOTE — ED Triage Notes (Addendum)
Patient here today due to concerns for a "mini stroke". Stated he had an episode of LOC 3 weeks ago and saw his PCP for mx testing. But earlier today around 11am he felt some glitch on his left eye, metallic taste and pain behind his left upper thigh. Denies unilateral deficit of numbness and or weakness, ambulated from triage to room with steady gait.

## 2023-08-17 NOTE — ED Provider Notes (Signed)
Assumed care from Dr. Wallace Cullens.  Patient CTA of his head and neck was normal today without any signs of narrowing or blockage.  MRI was negative.  At this time patient is stable for discharge home.   Gwyneth Sprout, MD 08/17/23 [phone]

## 2023-08-18 ENCOUNTER — Other Ambulatory Visit: Payer: Self-pay | Admitting: Family Medicine

## 2023-08-18 DIAGNOSIS — G459 Transient cerebral ischemic attack, unspecified: Secondary | ICD-10-CM

## 2023-08-21 ENCOUNTER — Other Ambulatory Visit (HOSPITAL_COMMUNITY): Payer: Self-pay | Admitting: Family Medicine

## 2023-08-21 DIAGNOSIS — G459 Transient cerebral ischemic attack, unspecified: Secondary | ICD-10-CM

## 2023-08-22 ENCOUNTER — Encounter (HOSPITAL_COMMUNITY): Payer: Self-pay

## 2023-09-07 ENCOUNTER — Ambulatory Visit (HOSPITAL_COMMUNITY): Payer: Medicare Other | Attending: Family Medicine

## 2023-09-07 DIAGNOSIS — G459 Transient cerebral ischemic attack, unspecified: Secondary | ICD-10-CM | POA: Insufficient documentation

## 2023-09-07 LAB — ECHOCARDIOGRAM COMPLETE
Area-P 1/2: 2.5 cm2
S' Lateral: 2.3 cm

## 2023-09-29 LAB — NMR, LIPOPROFILE
Cholesterol, Total: 229 mg/dL — ABNORMAL HIGH (ref 100–199)
HDL Particle Number: 28.6 umol/L — ABNORMAL LOW (ref >=30.5)
HDL-C: 37 mg/dL — ABNORMAL LOW (ref >39)
LDL Particle Number: 2272 nmol/L — ABNORMAL HIGH (ref <1000)
LDL Size: 19.7 nmol — ABNORMAL LOW (ref >20.5)
LDL-C (NIH Calc): 155 mg/dL — ABNORMAL HIGH (ref 0–99)
LP-IR Score: 51 — ABNORMAL HIGH (ref <=45)
Small LDL Particle Number: 1744 nmol/L — ABNORMAL HIGH (ref <=527)
Triglycerides: 202 mg/dL — ABNORMAL HIGH (ref 0–149)

## 2023-10-08 IMAGING — CT CT CARDIAC CORONARY ARTERY CALCIUM SCORE
3 series · 14 of 20 positions shown, 16 images · non-contrast
Comparison: 01/04/2013
COMPARISON: 01/04/2013

Addendum:
EXAM:
OVER-READ INTERPRETATION  CT CHEST

The following report is an over-read performed by radiologist Dr.
Ricardo Raul Minda [REDACTED] on 12/02/2021. This
over-read does not include interpretation of cardiac or coronary
anatomy or pathology. The coronary calcium score interpretation by
the cardiologist is attached.
CLINICAL DATA: Cardiovascular Disease Risk stratification
Coronary Calcium Score
TECHNIQUE: A gated, non-contrast computed tomography scan of the heart was
performed using 3mm slice thickness. Axial images were analyzed on a
dedicated workstation. Calcium scoring of the coronary arteries was
performed using the Agatston method.

[Series 2: ax lung · axial · 0.77mm/px · z∈[+958,+1048]mm · 5 of 69 slices shown]
[im 12/69  lung]
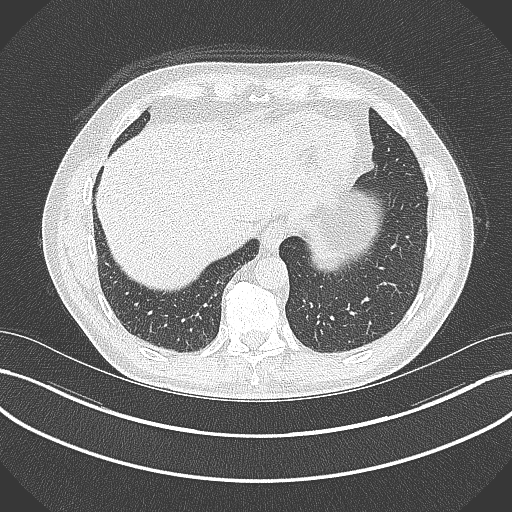
[im 23/69  lung]
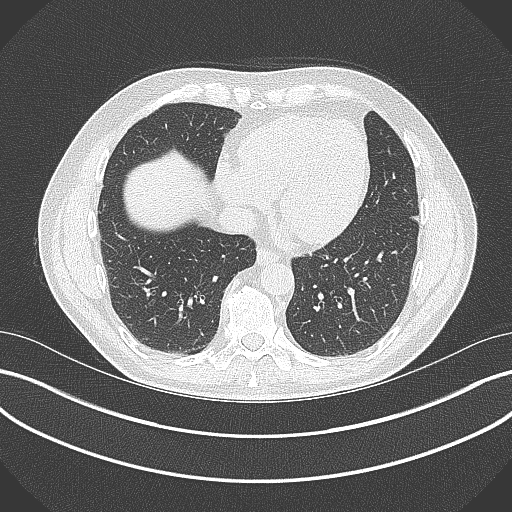
[im 35/69  lung]
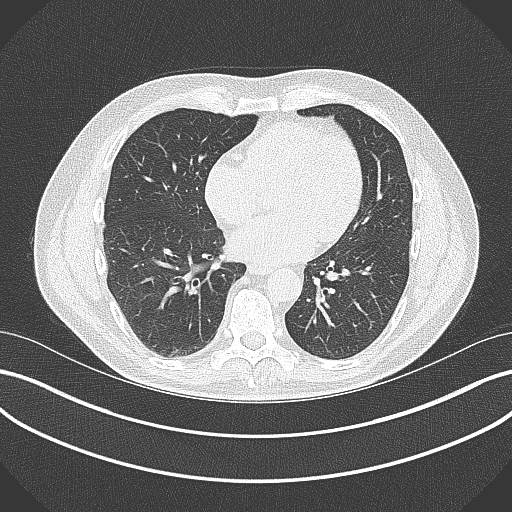
[im 46/69  lung]
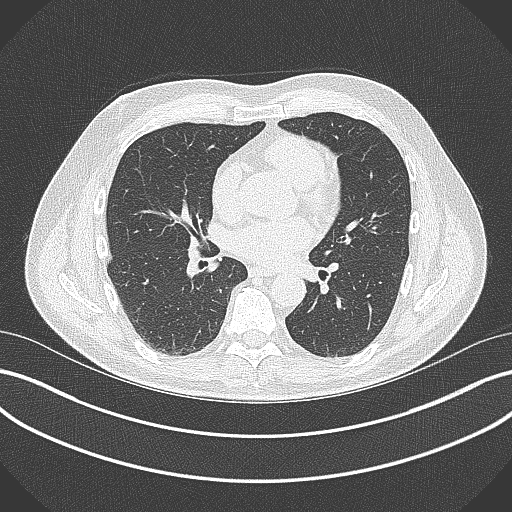
[im 57/69  lung]
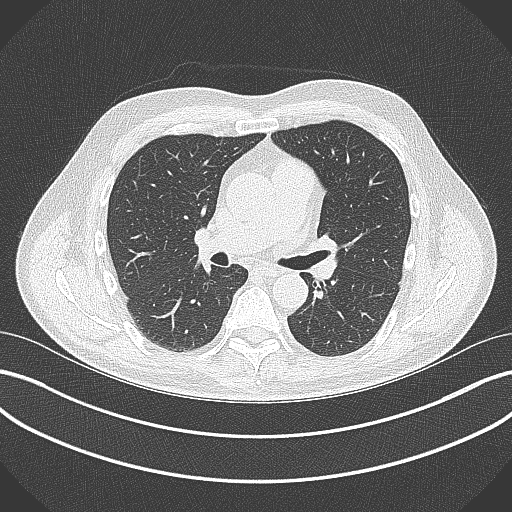

[Series 3: cascseq 3.0 sa36 70% (id) · axial · 0.39mm/px · z∈[+970,+1036]mm · 3 of 46 slices shown]
[im 12/46  vessel]
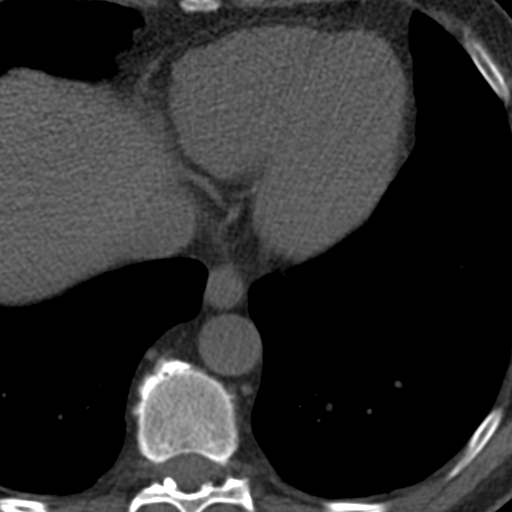
[im 23/46  vessel]
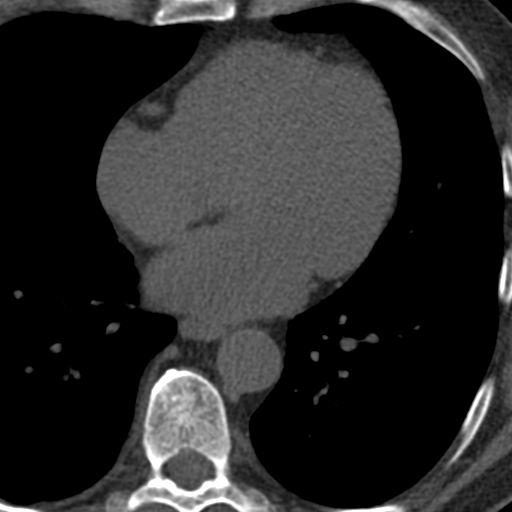
[im 34/46  vessel]
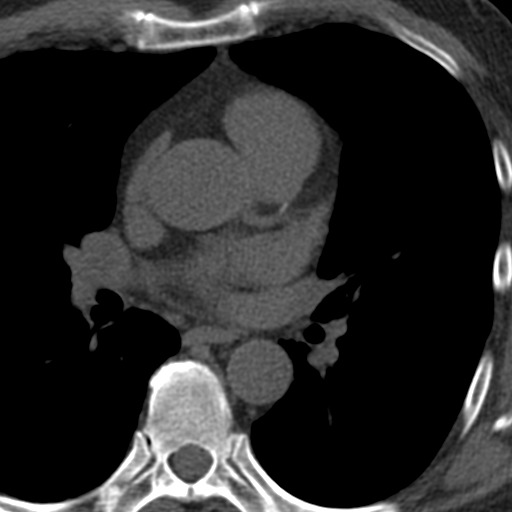

[Series 4: ax st · axial · 0.66mm/px · z∈[+954,+1052]mm · 6 of 69 slices shown, 8 images]
[im 10/69  vessel]
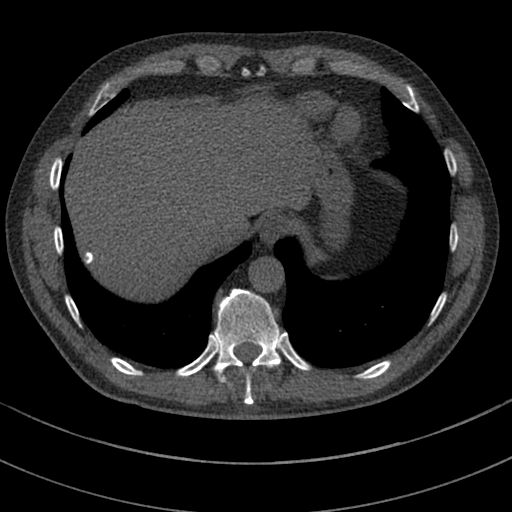
[im 10/69  lung]
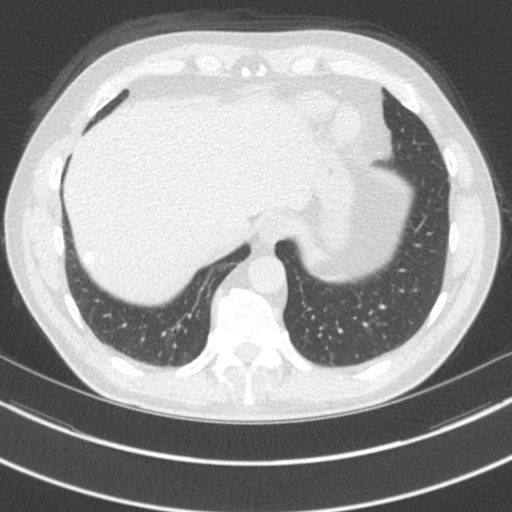
[im 20/69  vessel]
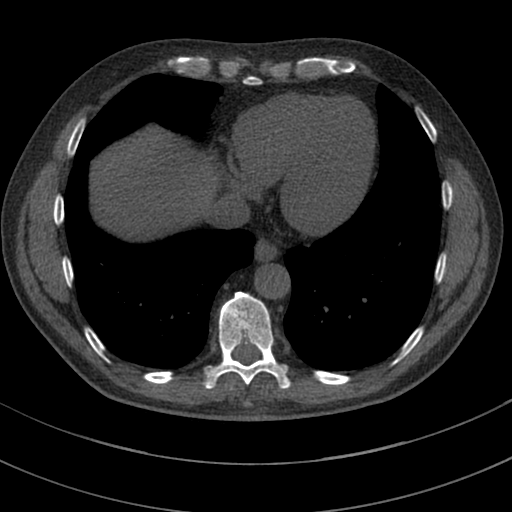
[im 30/69  vessel]
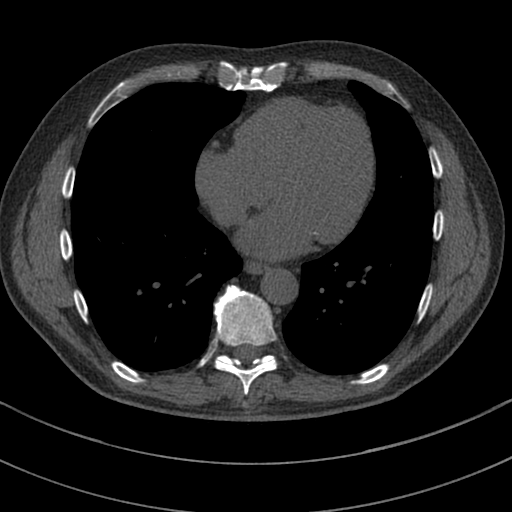
[im 39/69  vessel]
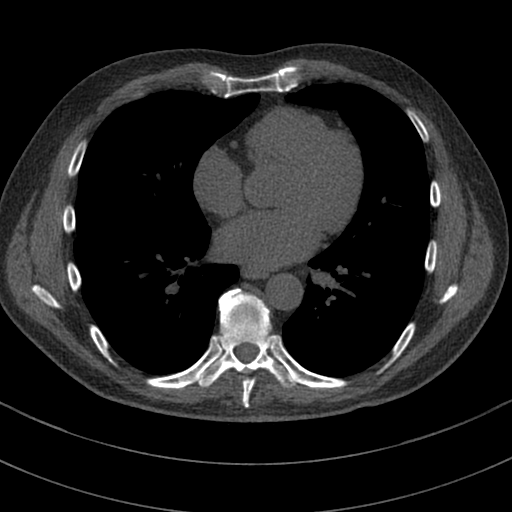
[im 49/69  vessel]
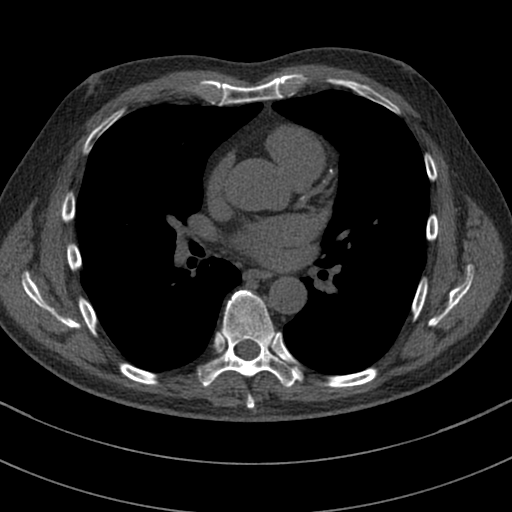
[im 49/69  lung]
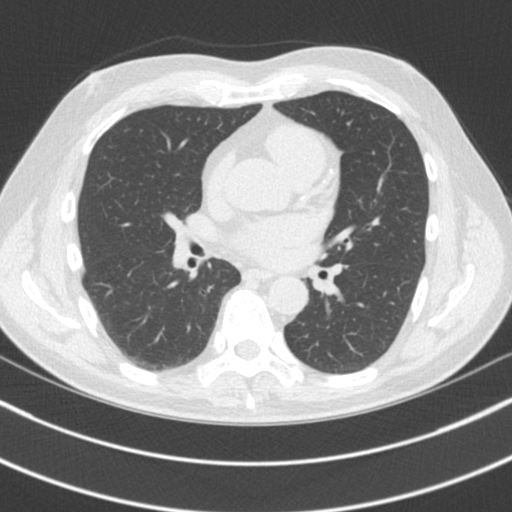
[im 59/69  vessel]
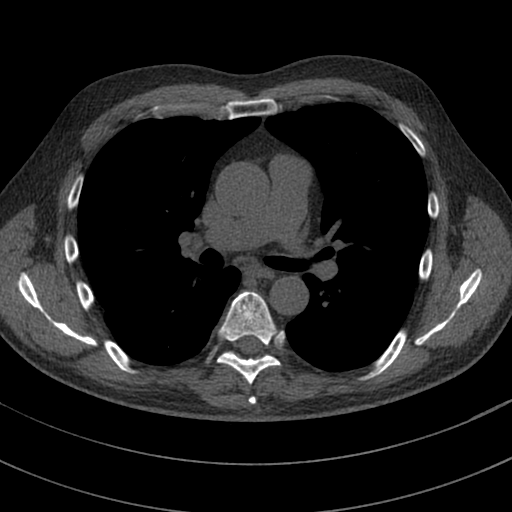

[14 of 20 positions shown; findings below may reference images not displayed]

FINDINGS: Vascular: Scattered aortic calcifications in the arch and root. No
aneurysm. Heart is normal size.

Mediastinum/Nodes: No adenopathy

Lungs/Pleura: No confluent opacities or effusions.

Upper Abdomen: No acute findings. Benign appearing calcification in
the posterior right hepatic dome, stable.

Musculoskeletal: Chest wall soft tissues are unremarkable. No acute
bony abnormality.
IMPRESSION: Scattered calcifications in the aortic root and distal aortic arch.
No aneurysm.

No acute extra cardiac abnormality.
FINDINGS: Coronary arteries: Normal origins.

Coronary Calcium Score:

Left main:

Left anterior descending artery: 15

Left circumflex artery:

Right coronary artery:

Total: 15

Percentile: 35

Pericardium: Normal.

Ascending Aorta: Normal caliber.

Non-cardiac: See separate report from [REDACTED].
IMPRESSION: Coronary calcium score of 15. This was 35 percentile for age-,
race-, and sex-matched controls.



If CAC=0, it is reasonable to withhold statin therapy and reassess
in 5 to 10 years, as long as higher risk conditions are absent
(diabetes mellitus, family history of premature CHD in first degree
relatives (males <55 years; females <65 years), cigarette smoking,
or LDL >=190 mg/dL).

If CAC is 1 to 99, it is reasonable to initiate statin therapy for
patients >=55 years of age.

If CAC is >=100 or >=75th percentile, it is reasonable to initiate
statin therapy at any age.

Cardiology referral should be considered for patients with CAC
scores >=400 or >=75th percentile.

*1837 AHA/ACC/AACVPR/AAPA/ABC/RUBIETH/PAVONE/BETTENDORF/Nereida/SORAKA/KRISTINE/CALHAN
Guideline on the Management of Blood Cholesterol: A Report of the
American College of Cardiology/American Heart Association Task Force
on Clinical Practice Guidelines. J Am Coll Cardiol.
9928;73(24):3476-3606.

*** End of Addendum ***
EXAM:
OVER-READ INTERPRETATION  CT CHEST

The following report is an over-read performed by radiologist Dr.
Ricardo Raul Minda [REDACTED] on 12/02/2021. This
over-read does not include interpretation of cardiac or coronary
anatomy or pathology. The coronary calcium score interpretation by
the cardiologist is attached.
FINDINGS: Vascular: Scattered aortic calcifications in the arch and root. No
aneurysm. Heart is normal size.

Mediastinum/Nodes: No adenopathy

Lungs/Pleura: No confluent opacities or effusions.

Upper Abdomen: No acute findings. Benign appearing calcification in
the posterior right hepatic dome, stable.

Musculoskeletal: Chest wall soft tissues are unremarkable. No acute
bony abnormality.
IMPRESSION: Scattered calcifications in the aortic root and distal aortic arch.
No aneurysm.

No acute extra cardiac abnormality.

## 2023-10-11 ENCOUNTER — Encounter (HOSPITAL_BASED_OUTPATIENT_CLINIC_OR_DEPARTMENT_OTHER): Payer: Self-pay | Admitting: Internal Medicine

## 2023-10-11 ENCOUNTER — Ambulatory Visit (INDEPENDENT_AMBULATORY_CARE_PROVIDER_SITE_OTHER): Payer: Medicare Other | Admitting: Internal Medicine

## 2023-10-11 VITALS — BP 118/80 | HR 71 | Ht 69.0 in | Wt 161.0 lb

## 2023-10-11 DIAGNOSIS — R931 Abnormal findings on diagnostic imaging of heart and coronary circulation: Secondary | ICD-10-CM

## 2023-10-11 DIAGNOSIS — T466X5A Adverse effect of antihyperlipidemic and antiarteriosclerotic drugs, initial encounter: Secondary | ICD-10-CM

## 2023-10-11 DIAGNOSIS — M791 Myalgia, unspecified site: Secondary | ICD-10-CM | POA: Diagnosis not present

## 2023-10-11 DIAGNOSIS — T466X5D Adverse effect of antihyperlipidemic and antiarteriosclerotic drugs, subsequent encounter: Secondary | ICD-10-CM

## 2023-10-11 DIAGNOSIS — E785 Hyperlipidemia, unspecified: Secondary | ICD-10-CM

## 2023-10-11 MED ORDER — NEXLETOL 180 MG PO TABS: 180.0000 mg | ORAL_TABLET | Freq: Every day | ORAL | 3 refills | Status: DC

## 2023-10-11 NOTE — Patient Instructions (Signed)
Medication Instructions:  Prescription for NEXLETOL has been sent to your pharmacy   The [facility] offers assistance to help pay for medication copays.  They will cover copays for all cholesterol lowering meds, including statins, fibrates, omega-3 fish oils like Vascepa, ezetimibe, Repatha, Praluent, Nexletol, Nexlizet.  The cards are usually good for $2,500 or 12 months, whichever comes first. Our fax # is [phone] (you will need this to apply) Go to [email or web address] Click on "Apply Now" Answer questions as to whom is applying (patient or representative) Your disease fund will be "hypercholesterolemia - Medicare access" They will ask questions about finances and which medications you are taking for cholesterol When you submit, the approval is usually within minutes.  You will need to print the card information from the site You will need to show this information to your pharmacy, they will bill your Medicare Part D plan first -then bill [employer] --for the copay.   You can also call them at [phone], although the hold times can be quite long.    *If you need a refill on your cardiac medications before your next appointment, please call your pharmacy*   Lab Work: FASTING lab work due in about 4 months  If you have labs (blood work) drawn today and your tests are completely normal, you will receive your results only by: MyChart Message (if you have MyChart) OR A paper copy in the mail If you have any lab test that is abnormal or we need to change your treatment, we will call you to review the results.   Follow-Up: At [facility], you and your health needs are our priority.  As part of our continuing mission to provide you with exceptional heart care, we have created designated Provider Care Teams.  These Care Teams include your primary Cardiologist (physician) and Advanced Practice Providers (APPs -  Physician Assistants and Nurse Practitioners) who  all work together to provide you with the care you need, when you need it.  We recommend signing up for the patient portal called "MyChart".  Sign up information is provided on this After Visit Summary.  MyChart is used to connect with patients for Virtual Visits (Telemedicine).  Patients are able to view lab/test results, encounter notes, upcoming appointments, etc.  Non-urgent messages can be sent to your provider as well.   To learn more about what you can do with MyChart, go to [email or web address].    Your next appointment:   4 months with Dr. Rennis Golden

## 2023-10-11 NOTE — Progress Notes (Signed)
LIPID CLINIC CONSULT NOTE  Chief Complaint:  Follow-up dyslipidemia  Primary Care Physician: Mila Palmer, MD  Primary Cardiologist:  None  HPI:  Dave Poole is a 67 y.o. male who is being seen today for the evaluation of dyslipidemia at the request of Mila Palmer, MD. This is a pleasant [place] male with a history of mildly elevated coronary calcium with a score of 15, 35th percentile for age and sex matched controls.  Recently he had lipids which showed total cholesterol of 200, triglycerides 180, HDL 51 and LDL of 118.  Prior to that the LDL had been a little higher at 128.  He is not currently on any lipid-lowering therapies.  He had previously taken simvastatin, about 40 pills and noted that he was having joint and muscle pains.  Prior to that he had taken Crestor which also cause muscle aches.  Based on that he was referred for medication recommendations.  01/04/2023  Dave Poole returns today for follow-up.  He has done well with ezetimibe and notes no side effects.  His lipids are better however essentially back to what his labs demonstrated about a year ago.  Cholesterols come down from 148 down to 115.  His LDL particle number is 1254, triglycerides are now normal at 145, HDL 42 and small LDL particle #508.  Overall pretty good improvement in his lipids.  He is still slightly above target.  I suspect he will need additional therapy as he has a very well-controlled diet.  He has tried to make even further changes over the past week.  Despite this, I think that he is going up against the genetic dyslipidemia and will need additional therapy.  05/30/2023  Dave Poole is seen today in follow-up.  Unfortunately he recently had developed some overall body aches particularly in his shoulders and hips which he thought might be related to ezetimibe.  He stopped that medicine in early May and had recent repeat labs in June showing total cholesterol 223, HDL 41, triglycerides  149 and LDL 155.  This was up from an LDL of 115 on ezetimibe.  Currently he is on no lipid-lowering therapy.  We had discussed the possibility of adding Nexletol however it was declined because he was not on Medicare.  Subsequently he has looked into options for Medicare which would start as of July 1.  He also mention today he gets some shortness of breath with short burst activities such as chasing after his dog or changing position that lasts just a few seconds.  He notes that he is able to exercise on a treadmill for 30 to 45 minutes without any significant or unusual shortness of breath or limitation to exercise.  He denies any chest pain.  This does not sound typically cardiac.  He does have some history of atelectasis on imaging which could contribute to this.  10/11/2023  Dave Poole is seen today in follow-up.  He had an uneventful past month.  He had returned from [place] and had an episode near where he collapsed.  It sounds like he may have had vertigo because he did not syncopized or lose consciousness but fell.  He saw his PCP who thought he may have had a stroke.  Subsequently developed some twitching around his eyelid and some other neurologic symptoms and presented to the emergency department.  He underwent workup including a brain MRI and CT angio of the head and neck.  Fortunately this showed no significant findings.  Afterwards he  felt that he might be suffering from cervical vertigo.  He started doing some exercises with his neck and notes that that is improved a lot.  He also felt that he is change his positioning including how he watched TV at night, previously supine with his head flexed forward at a significant angle.  Since improving his posture he says this has helped a lot as well.  He did have repeat lipids.  He had to stop the Nexletol after a week on samples because of side effects but is not sure if this was a lingering effect of his previous zetia therapy.  Lipids now however are  worse compared to previous cholesterol numbers.  His LDL particle number is 2272, LDL at 155, HDL 37 triglycerides 202.  PMHx:  Past Medical History:  Diagnosis Date   Dyslipidemia    Elevated coronary artery calcium score    GERD (gastroesophageal reflux disease)    Hepatic steatosis     History reviewed. No pertinent surgical history.  FAMHx:  History reviewed. No pertinent family history.  No premature onset coronary artery disease  SOCHx:   reports that he has never smoked. He does not have any smokeless tobacco history on file. He reports current alcohol use. No history on file for drug use.  ALLERGIES:  Allergies  Allergen Reactions   Statins Other (See Comments)    Myalgias - simvastatin, rosuvastatin     ROS: Pertinent items noted in HPI and remainder of comprehensive ROS otherwise negative.  HOME MEDS: Current Outpatient Medications on File Prior to Visit  Medication Sig Dispense Refill   aspirin EC 81 MG tablet Take 81 mg by mouth daily.     Probiotic Product (PROBIOTIC PO) Take 1 capsule by mouth daily.     valACYclovir (VALTREX) 1000 MG tablet Take 1,000 mg by mouth as needed.     No current facility-administered medications on file prior to visit.    LABS/IMAGING: No results found for this or any previous visit (from the past 48 hour(s)). No results found.  LIPID PANEL: No results found for: "CHOL", "TRIG", "HDL", "CHOLHDL", "VLDL", "LDLCALC", "LDLDIRECT"  WEIGHTS: Wt Readings from Last 3 Encounters:  10/11/23 161 lb (73 kg)  08/17/23 160 lb (72.6 kg)  05/30/23 160 lb 9.6 oz (72.8 kg)    VITALS: BP 118/80   Pulse 71   Ht 5\' 9"  (1.753 m)   Wt 161 lb (73 kg)   SpO2 98%   BMI 23.78 kg/m   EXAM: Deferred  EKG: Deferred  ASSESSMENT: Mixed dyslipidemia, goal LDL <100 Statin myalgias Elevated CAC score of 15, 35th percentile Ezetimibe intolerance  PLAN: 1.   Dave Poole has had further increase in his cholesterol although he feels like  he has improved his diet and more physical activity.  He says he overall feels very well.  He is agreeable to trying to restart the Nexletol.  He has some samples and received prior authorization for this.  If tolerated we will plan repeat lipids in about 3 to 4 months and follow-up afterwards.  Chrystie Nose, MD, [facility], FACP  [place]  [facility]  Medical Director of the Advanced Lipid Disorders &  Cardiovascular Risk Reduction Clinic Diplomate of the American Board of Clinical Lipidology Attending Cardiologist  Direct Dial: [phone]  Fax: [phone]  Website:  [email or web address] 10/11/2023, 9:50 AM

## 2024-02-18 LAB — NMR, LIPOPROFILE
Cholesterol, Total: 200 mg/dL — ABNORMAL HIGH (ref 100–199)
HDL Particle Number: 35.4 umol/L (ref >=30.5)
HDL-C: 46 mg/dL (ref >39)
LDL Particle Number: 1324 nmol/L — ABNORMAL HIGH (ref <1000)
LDL Size: 20.6 nm (ref >20.5)
LDL-C (NIH Calc): 113 mg/dL — ABNORMAL HIGH (ref 0–99)
LP-IR Score: 67 — ABNORMAL HIGH (ref <=45)
Small LDL Particle Number: 764 nmol/L — ABNORMAL HIGH (ref <=527)
Triglycerides: 234 mg/dL — ABNORMAL HIGH (ref 0–149)

## 2024-02-22 ENCOUNTER — Other Ambulatory Visit: Payer: Self-pay

## 2024-02-22 MED ORDER — NEXLETOL 180 MG PO TABS: 180.0000 mg | ORAL_TABLET | Freq: Every day | ORAL | 2 refills | Status: DC

## 2024-02-27 ENCOUNTER — Other Ambulatory Visit (HOSPITAL_COMMUNITY): Payer: Self-pay

## 2024-02-27 ENCOUNTER — Ambulatory Visit (INDEPENDENT_AMBULATORY_CARE_PROVIDER_SITE_OTHER): Payer: Medicare Other | Admitting: Internal Medicine

## 2024-02-27 ENCOUNTER — Encounter (HOSPITAL_BASED_OUTPATIENT_CLINIC_OR_DEPARTMENT_OTHER): Payer: Self-pay | Admitting: Internal Medicine

## 2024-02-27 ENCOUNTER — Telehealth: Payer: Self-pay | Admitting: Pharmacy Technician

## 2024-02-27 VITALS — BP 114/76 | HR 64 | Ht 69.0 in | Wt 157.1 lb

## 2024-02-27 DIAGNOSIS — E785 Hyperlipidemia, unspecified: Secondary | ICD-10-CM

## 2024-02-27 DIAGNOSIS — M791 Myalgia, unspecified site: Secondary | ICD-10-CM | POA: Diagnosis not present

## 2024-02-27 DIAGNOSIS — T466X5D Adverse effect of antihyperlipidemic and antiarteriosclerotic drugs, subsequent encounter: Secondary | ICD-10-CM | POA: Diagnosis not present

## 2024-02-27 DIAGNOSIS — R931 Abnormal findings on diagnostic imaging of heart and coronary circulation: Secondary | ICD-10-CM

## 2024-02-27 MED ORDER — NEXLIZET 180-10 MG PO TABS: 1.0000 | ORAL_TABLET | Freq: Every day | ORAL | 3 refills | Status: DC

## 2024-02-27 MED ORDER — NEXLIZET 180-10 MG PO TABS: 1.0000 | ORAL_TABLET | Freq: Every day | ORAL | 0 refills | Status: DC

## 2024-02-27 NOTE — Telephone Encounter (Signed)
 Update on med sent to patient via MyChart

## 2024-02-27 NOTE — Telephone Encounter (Signed)
 Pharmacy Patient Advocate Encounter   Received notification from Physician's Office that prior authorization for Nexlizet is required/requested.   Insurance verification completed.   The patient is insured through [employer] .   Per test claim: PA required; PA submitted to above mentioned insurance via CoverMyMeds Key/confirmation #/EOC [id number] Status is pending

## 2024-02-27 NOTE — Progress Notes (Signed)
 LIPID CLINIC CONSULT NOTE  Chief Complaint:  Follow-up dyslipidemia  Primary Care Physician: Mila Palmer, MD  Primary Cardiologist:  None  HPI:  Dave Poole is a 68 y.o. male who is being seen today for the evaluation of dyslipidemia at the request of Mila Palmer, MD. This is a pleasant [place] male with a history of mildly elevated coronary calcium with a score of 15, 35th percentile for age and sex matched controls.  Recently he had lipids which showed total cholesterol of 200, triglycerides 180, HDL 51 and LDL of 118.  Prior to that the LDL had been a little higher at 128.  He is not currently on any lipid-lowering therapies.  He had previously taken simvastatin, about 40 pills and noted that he was having joint and muscle pains.  Prior to that he had taken Crestor which also cause muscle aches.  Based on that he was referred for medication recommendations.  01/04/2023  [name] returns today for follow-up.  He has done well with ezetimibe and notes no side effects.  His lipids are better however essentially back to what his labs demonstrated about a year ago.  Cholesterols come down from 148 down to 115.  His LDL particle number is 1254, triglycerides are now normal at 145, HDL 42 and small LDL particle #508.  Overall pretty good improvement in his lipids.  He is still slightly above target.  I suspect he will need additional therapy as he has a very well-controlled diet.  He has tried to make even further changes over the past week.  Despite this, I think that he is going up against the genetic dyslipidemia and will need additional therapy.  05/30/2023  [name] is seen today in follow-up.  Unfortunately he recently had developed some overall body aches particularly in his shoulders and hips which he thought might be related to ezetimibe.  He stopped that medicine in early May and had recent repeat labs in June showing total cholesterol 223, HDL 41, triglycerides  149 and LDL 155.  This was up from an LDL of 115 on ezetimibe.  Currently he is on no lipid-lowering therapy.  We had discussed the possibility of adding Nexletol however it was declined because he was not on Medicare.  Subsequently he has looked into options for Medicare which would start as of July 1.  He also mention today he gets some shortness of breath with short burst activities such as chasing after his dog or changing position that lasts just a few seconds.  He notes that he is able to exercise on a treadmill for 30 to 45 minutes without any significant or unusual shortness of breath or limitation to exercise.  He denies any chest pain.  This does not sound typically cardiac.  He does have some history of atelectasis on imaging which could contribute to this.  10/11/2023  [name] is seen today in follow-up.  He had an uneventful past month.  He had returned from [place] and had an episode near where he collapsed.  It sounds like he may have had vertigo because he did not syncopized or lose consciousness but fell.  He saw his PCP who thought he may have had a stroke.  Subsequently developed some twitching around his eyelid and some other neurologic symptoms and presented to the emergency department.  He underwent workup including a brain MRI and CT angio of the head and neck.  Fortunately this showed no significant findings.  Afterwards he  felt that he might be suffering from cervical vertigo.  He started doing some exercises with his neck and notes that that is improved a lot.  He also felt that he is change his positioning including how he watched TV at night, previously supine with his head flexed forward at a significant angle.  Since improving his posture he says this has helped a lot as well.  He did have repeat lipids.  He had to stop the Nexletol after a week on samples because of side effects but is not sure if this was a lingering effect of his previous zetia therapy.  Lipids now however are  worse compared to previous cholesterol numbers.  His LDL particle number is 2272, LDL at 155, HDL 37 triglycerides 202.  02/27/2024  [name] returns today for follow-up.  He reports compliance with the Nexletol and however has been eating a varied but generally healthy diet.  Weight is down about 4 pounds since I saw him.  His cholesterol has improved reasonably well with an LDL of 113 (down from 155), LDL particle number now 1324 (down from 2272), small increase in triglycerides up to 234 and a marked improvement in his small LDL particle numbers from 6213 down to 764.  PMHx:  Past Medical History:  Diagnosis Date   Dyslipidemia    Elevated coronary artery calcium score    GERD (gastroesophageal reflux disease)    Hepatic steatosis     No past surgical history on file.  FAMHx:  No family history on file.  No premature onset coronary artery disease  SOCHx:   reports that he has never smoked. He does not have any smokeless tobacco history on file. He reports current alcohol use. No history on file for drug use.  ALLERGIES:  Allergies  Allergen Reactions   Statins Other (See Comments)    Myalgias - simvastatin, rosuvastatin     ROS: Pertinent items noted in HPI and remainder of comprehensive ROS otherwise negative.  HOME MEDS: Current Outpatient Medications on File Prior to Visit  Medication Sig Dispense Refill   Bempedoic Acid (NEXLETOL) 180 MG TABS Take 1 tablet (180 mg total) by mouth daily. 90 tablet 2   Probiotic Product (PROBIOTIC PO) Take 1 capsule by mouth daily.     valACYclovir (VALTREX) 1000 MG tablet Take 1,000 mg by mouth as needed.     aspirin EC 81 MG tablet Take 81 mg by mouth daily. (Patient not taking: Reported on 02/27/2024)     No current facility-administered medications on file prior to visit.    LABS/IMAGING: No results found for this or any previous visit (from the past 48 hours). No results found.  LIPID PANEL: No results found for: "CHOL",  "TRIG", "HDL", "CHOLHDL", "VLDL", "LDLCALC", "LDLDIRECT"  WEIGHTS: Wt Readings from Last 3 Encounters:  02/27/24 157 lb 1.6 oz (71.3 kg)  10/11/23 161 lb (73 kg)  08/17/23 160 lb (72.6 kg)    VITALS: BP 114/76   Pulse 64   Ht 5\' 9"  (1.753 m)   Wt 157 lb 1.6 oz (71.3 kg)   SpO2 98%   BMI 23.20 kg/m   EXAM: Deferred  EKG: Deferred  ASSESSMENT: Mixed dyslipidemia, goal LDL <100 Statin myalgias Elevated CAC score of 15, 35th percentile Ezetimibe intolerance  PLAN: 1.   Mr. Cumbie has had a good improvement in his particle numbers and LDL but remains above a target less than 100.  We discussed combination therapy with Nexletol and Zetia (Nexlizet) and he  is agreeable to trying that.  So far he is tolerating the Nexletol without any issues.  He understands that Zetia could have some GI side effects and he should monitor for that, but otherwise if tolerated could give an additional 20% reduction in his lipids and is not particularly more expensive in combination therapy with Nexletol then alone.  Plan repeat lipids and follow-up in 6 months or sooner as necessary likely with [facility].  Chrystie Nose, MD, [facility], FACP  [place]  [facility]  Medical Director of the Advanced Lipid Disorders &  Cardiovascular Risk Reduction Clinic Diplomate of the American Board of Clinical Lipidology Attending Cardiologist  Direct Dial: [phone]  Fax: [phone]  Website:  [email or web address] 02/27/2024, 9:52 AM

## 2024-02-27 NOTE — Telephone Encounter (Signed)
 Pharmacy Patient Advocate Encounter  Received notification from [employer] that Prior Authorization for Nexlizet has been APPROVED from 01/28/24 to 02/26/25. Ran test claim, Copay is $371.52- one month (DEDUCTIBLE). This test claim was processed through [facility]- copay amounts may vary at other pharmacies due to pharmacy/plan contracts, or as the patient moves through the different stages of their insurance plan.   PA #/Case ID/Reference #: [id number]

## 2024-02-27 NOTE — Patient Instructions (Signed)
 Medication Instructions:  STOP nexletol   START nexlizet 180/10mg  once daily (combination of nexletol + zetia)  *If you need a refill on your cardiac medications before your next appointment, please call your pharmacy*   Lab Work: FASTING NMR lipoprofile in 6 months  If you have labs (blood work) drawn today and your tests are completely normal, you will receive your results only by: MyChart Message (if you have MyChart) OR A paper copy in the mail If you have any lab test that is abnormal or we need to change your treatment, we will call you to review the results.   Follow-Up: At [facility], you and your health needs are our priority.  As part of our continuing mission to provide you with exceptional heart care, we have created designated Provider Care Teams.  These Care Teams include your primary Cardiologist (physician) and Advanced Practice Providers (APPs -  Physician Assistants and Nurse Practitioners) who all work together to provide you with the care you need, when you need it.  We recommend signing up for the patient portal called "MyChart".  Sign up information is provided on this After Visit Summary.  MyChart is used to connect with patients for Virtual Visits (Telemedicine).  Patients are able to view lab/test results, encounter notes, upcoming appointments, etc.  Non-urgent messages can be sent to your provider as well.   To learn more about what you can do with MyChart, go to [email or web address].    Your next appointment:   6 months with Eligha Bridegroom NP -- lipid clinic

## 2024-02-27 NOTE — Telephone Encounter (Signed)
-----   Message from Nurse Eileen Stanford E sent at 02/27/2024 10:16 AM EDT ----- Regarding: nexlizet PA Hi team!  Patient was previously on Nexletol and we changed to Nexlizet today. Can you see if a PA is needed?   Thanks!

## 2024-08-16 ENCOUNTER — Ambulatory Visit: Payer: Self-pay | Admitting: Internal Medicine

## 2024-08-16 LAB — NMR, LIPOPROFILE
Cholesterol, Total: 172 mg/dL (ref 100–199)
HDL Particle Number: 41.7 umol/L (ref >=30.5)
HDL-C: 56 mg/dL (ref >39)
LDL Particle Number: 1361 nmol/L — ABNORMAL HIGH (ref <1000)
LDL Size: 21 nm (ref >20.5)
LDL-C (NIH Calc): 98 mg/dL (ref 0–99)
LP-IR Score: 50 — ABNORMAL HIGH (ref <=45)
Small LDL Particle Number: 700 nmol/L — ABNORMAL HIGH (ref <=527)
Triglycerides: 100 mg/dL (ref 0–149)

## 2024-08-28 NOTE — Progress Notes (Signed)
 Cardiology Office Note   Date:  08/29/2024  ID:  Dave Poole, DOB 01-01-1956, MRN [id number] PCP: Verena Mems, MD  [facility] Providers Cardiologist:  None     PMH Dyslipidemia Coronary artery calcification CT Calcium score 11/2021 CAC 15 in LAD (35th percentile) Hepatic steatosis Statin myalgia Echocardiogram 09/07/2023 LVEF 60-65%, mild LVH, G1DD  Referred to Advanced Lipid Disorders clinic and seen by Dr. Mona 08/23/2022.  History of dyslipidemia and mildly elevated coronary calcium score of 15.  His lipid panel at that time revealed total cholesterol 200, triglycerides 180, HDL 51, and LDL 118.  Prior to that LDL was a little higher at 128.  He was not on lipid-lowering therapies at that time.  Had previously taken simvastatin and noted joint and muscle pain.  Prior to that he had taken Crestor which caused muscle aches.  He was started on ezetimibe  10 mg daily with improvement in LDL, but still not at target.  He later developed body aches he thought secondary to ezetimibe  and stopped the medication.  LDL increased to 155, up from 115.  At office visit 10/11/2023 he reported an episode in [place] where he collapsed.  He did not have syncope or lose consciousness but did fall.  Brain MRI and CT angio of the head and neck did not show any significant findings.  He was thought to have cervical vertigo.  He had improvement in symptoms with correction of posture.  He was started on Nexletol  and had improvement with LDL down to 113 from 155, LDL particle #1324, down from 20 to 72, small increase in triglycerides up to 234, and marked improvement in his small LDL from 1744 to 764.  He was advised to switch from Nexletol  to Nexlizet  for additional lipid lowering benefit.   NMR 08/15/24 with LDL particle number 1361, LDL-C 98, HDL-C 56, triglycerides 100, total cholesterol 172, small LDL-P 700.   History of Present Illness Discussed the use of AI scribe software for clinical note  transcription with the patient, who gave verbal consent to proceed.  History of Present Illness Dave Poole is a very pleasant 68 year old male with hyperlipidemia who presents for follow-up of cholesterol management. He has been taking Nexlizet  for approximately six months, which has improved his cholesterol levels, reducing LDL from 118 to 98. He is intolerant to statins and prefers to avoid medications when possible. Diet is healthy, primarily consisting of home-cooked meals, vegetables, fish, and limited meat, with minimal sugar intake. He occasionally has 1-2 alcoholic beverages on a given day.  He exercises regularly with cardio and muscle workouts, maintaining a heart rate of 145-160 bpm, which recovers quickly to below 120 bpm. He hydrates well. He experiences occasional shortness of breath with sudden movements but not during regular exercise. He denies chest pain, palpitations, orthopnea, PND, edema, presyncope, syncope.   ROS: See HPI  Studies Reviewed EKG Interpretation Date/Time:  Thursday August 29 2024 09:19:52 EDT Ventricular Rate:  65 PR Interval:  150 QRS Duration:  88 QT Interval:  384 QTC Calculation: 399 R Axis:   37  Text Interpretation: Normal sinus rhythm Normal ECG When compared with ECG of 17-Aug-2023 11:42, PREVIOUS ECG IS PRESENT Confirmed by Percy Browning [phone]) on 08/29/2024 9:46:29 AM     Lipoprotein (a)  Date/Time Value Ref Range Status  12/29/2022 09:05 AM 50.9 <75.0 nmol/L Final    Comment:    Note:  Values greater than or equal to 75.0 nmol/L may  indicate an independent risk factor for CHD,        but must be evaluated with caution when applied        to non-Caucasian populations due to the        influence of genetic factors on Lp(a) across        ethnicities.     Risk Assessment/Calculations   HYPERTENSION CONTROL Vitals:   08/29/24 0917 08/29/24 1005  BP: (!) 142/82 (!) 144/78    The patient's blood pressure is  elevated above target today.  In order to address the patient's elevated BP: Blood pressure will be monitored at home to determine if medication changes need to be made.          Physical Exam VS:  BP (!) 144/78   Pulse 71   Resp 17   Ht 5' 9 (1.753 m)   Wt 158 lb (71.7 kg)   SpO2 98%   BMI 23.33 kg/m    Wt Readings from Last 3 Encounters:  08/29/24 158 lb (71.7 kg)  02/27/24 157 lb 1.6 oz (71.3 kg)  10/11/23 161 lb (73 kg)    GEN: Well nourished, well developed in no acute distress NECK: No JVD; No carotid bruits CARDIAC: RRR, no murmurs, rubs, gallops RESPIRATORY:  Clear to auscultation without rales, wheezing or rhonchi  ABDOMEN: Soft, non-tender, non-distended EXTREMITIES:  No edema; No deformity    Assessment & Plan Hyperlipidemia LDL goal < 70 Cardiac risk Coronary artery calcification Mildly elevated coronary artery calcium score of 15 (35th percentile) on CT 11/2021. Lipid panel completed 08/15/2024 with LDL particle #1361, LDL-C 98, HDL-C 56, triglycerides 100, total cholesterol 172, and small LDL-P 700.  He continues to have improvement in lipids since starting Nexlizet . LDL particle number was > 7999 in Oct 2024.  Advise goal LDL 70 or lower.  He did not tolerate several statins and does not want to try them even at a low dose.  We discussed PCSK9 inhibitor, however coverage would not be likely given no prior stroke or heart attack and normal LP(a). He prefers to continue lifestyle modification, admitting that he could have been a little more careful with his diet recently.  He reports occasional shortness of breath when jumping up to chase the dog.  He exercises consistently and does not have any concerning symptoms with exercise.  No chest pain, dyspnea, or other symptoms concerning for angina.  Lengthy discussion about red flag symptoms to report. EKG today reveals NSR, no ST/T abnormality.  - Continue Nexlizet  180/10 mg daily - Recheck lipid panel in six months -  Educated on signs of cardiac-related chest pain and when to seek medical attention - Aim for at least 150 minutes of moderate intensity exercise each week - Heart healthy, mostly whole food diet avoiding saturated fat, processed foods, sugar, and other simple carbohydrates  Elevated BP reading BP is elevated in clinic today and remains elevated on my recheck.  He does not monitor BP at home.  No prior history of elevated blood pressure to his awareness.  Admits he had a coffee and espresso prior to arrival. -Recommend home BP monitoring -Goal BP < 140/80        Dispo: 6 months   Signed, Rosaline Bane, NP-C

## 2024-08-29 ENCOUNTER — Encounter (HOSPITAL_BASED_OUTPATIENT_CLINIC_OR_DEPARTMENT_OTHER): Payer: Self-pay | Admitting: Nurse Practitioner

## 2024-08-29 ENCOUNTER — Ambulatory Visit (INDEPENDENT_AMBULATORY_CARE_PROVIDER_SITE_OTHER): Admitting: Nurse Practitioner

## 2024-08-29 VITALS — BP 144/78 | HR 71 | Resp 17 | Ht 69.0 in | Wt 158.0 lb

## 2024-08-29 DIAGNOSIS — E785 Hyperlipidemia, unspecified: Secondary | ICD-10-CM | POA: Diagnosis not present

## 2024-08-29 DIAGNOSIS — R931 Abnormal findings on diagnostic imaging of heart and coronary circulation: Secondary | ICD-10-CM | POA: Diagnosis not present

## 2024-08-29 DIAGNOSIS — R030 Elevated blood-pressure reading, without diagnosis of hypertension: Secondary | ICD-10-CM

## 2024-08-29 DIAGNOSIS — Z7189 Other specified counseling: Secondary | ICD-10-CM | POA: Diagnosis not present

## 2024-08-29 NOTE — Patient Instructions (Signed)
 Medication Instructions:   Your physician recommends that you continue on your current medications as directed. Please refer to the Current Medication list given to you today.   *If you need a refill on your cardiac medications before your next appointment, please call your pharmacy*  Lab Work:  Your physician recommends that you return for a FASTING NMR, fasting after midnight in 6 months a week prior to your appointment.    If you have labs (blood work) drawn today and your tests are completely normal, you will receive your results only by: MyChart Message (if you have MyChart) OR A paper copy in the mail If you have any lab test that is abnormal or we need to change your treatment, we will call you to review the results.  Testing/Procedures:  None ordered.  Follow-Up: At [facility], you and your health needs are our priority.  As part of our continuing mission to provide you with exceptional heart care, our providers are all part of one team.  This team includes your primary Cardiologist (physician) and Advanced Practice Providers or APPs (Physician Assistants and Nurse Practitioners) who all work together to provide you with the care you need, when you need it.  Your next appointment:   6 month(s)  Provider:   K. Italy Hilty, MD or Rosaline Bane, NP    We recommend signing up for the patient portal called MyChart.  Sign up information is provided on this After Visit Summary.  MyChart is used to connect with patients for Virtual Visits (Telemedicine).  Patients are able to view lab/test results, encounter notes, upcoming appointments, etc.  Non-urgent messages can be sent to your provider as well.   To learn more about what you can do with MyChart, go to [email or web address].   Other Instructions  Your physician wants you to follow-up in: 6 months.  You will receive a reminder letter in the mail two months in advance. If you don't receive a letter, please call  our office to schedule the follow-up appointment.

## 2025-01-03 ENCOUNTER — Telehealth: Payer: Self-pay | Admitting: Internal Medicine

## 2025-01-03 NOTE — Telephone Encounter (Signed)
 Pt c/o medication issue:  1. Name of Medication: Bempedoic Acid -Ezetimibe  (NEXLIZET ) 180-10 MG TABS   2. How are you currently taking this medication (dosage and times per day)? As prescribed   3. Are you having a reaction (difficulty breathing--STAT)? No   4. What is your medication issue? Pt needs prior authorization for medication. Please advise.    a

## 2025-01-06 ENCOUNTER — Other Ambulatory Visit (HOSPITAL_COMMUNITY): Payer: Self-pay

## 2025-01-06 ENCOUNTER — Telehealth: Payer: Self-pay | Admitting: Pharmacy Technician

## 2025-01-06 NOTE — Telephone Encounter (Signed)
 Pharmacy Patient Advocate Encounter  Received notification from [facility] that Prior Authorization for nexlizet  has been APPROVED from 01/06/25 to 12/11/25   PA #/Case ID/Reference #: [id number]

## 2025-01-06 NOTE — Telephone Encounter (Signed)
 PA request has been Submitted. New Encounter has been or will be created for follow up. For additional info see Pharmacy Prior Auth telephone encounter from 01/06/25.

## 2025-01-06 NOTE — Telephone Encounter (Signed)
" ° °  Pharmacy Patient Advocate Encounter   Received notification from Pt Calls Messages that prior authorization for nexlizet  is required/requested.   Insurance verification completed.   The patient is insured through [facility].   Per test claim: PA required; PA submitted to above mentioned insurance via Latent Key/confirmation #/EOC [id number] Status is pending  "

## 2025-01-09 ENCOUNTER — Other Ambulatory Visit: Payer: Self-pay | Admitting: Internal Medicine

## 2025-01-10 MED ORDER — NEXLIZET 180-10 MG PO TABS: 1.0000 | ORAL_TABLET | Freq: Every day | ORAL | 0 refills | Status: AC
